# Patient Record
Sex: Female | Born: 1945 | Race: White | Hispanic: No | Marital: Married | Smoking: Former smoker
Health system: Southern US, Community
[De-identification: ages and names within clinical notes are randomized; demographics above are authoritative.]

## PROBLEM LIST (undated history)

## (undated) DIAGNOSIS — T753XXA Motion sickness, initial encounter: Secondary | ICD-10-CM

## (undated) DIAGNOSIS — N83209 Unspecified ovarian cyst, unspecified side: Secondary | ICD-10-CM

## (undated) DIAGNOSIS — T7840XA Allergy, unspecified, initial encounter: Secondary | ICD-10-CM

## (undated) DIAGNOSIS — E559 Vitamin D deficiency, unspecified: Secondary | ICD-10-CM

## (undated) DIAGNOSIS — M8580 Other specified disorders of bone density and structure, unspecified site: Secondary | ICD-10-CM

## (undated) DIAGNOSIS — M1990 Unspecified osteoarthritis, unspecified site: Secondary | ICD-10-CM

## (undated) DIAGNOSIS — H409 Unspecified glaucoma: Secondary | ICD-10-CM

## (undated) DIAGNOSIS — E7800 Pure hypercholesterolemia, unspecified: Secondary | ICD-10-CM

## (undated) DIAGNOSIS — E041 Nontoxic single thyroid nodule: Secondary | ICD-10-CM

## (undated) HISTORY — PX: OVARIAN CYST REMOVAL: SHX89

## (undated) HISTORY — DX: Pure hypercholesterolemia, unspecified: E78.00

## (undated) HISTORY — DX: Nontoxic single thyroid nodule: E04.1

## (undated) HISTORY — DX: Unspecified ovarian cyst, unspecified side: N83.209

## (undated) HISTORY — PX: COLONOSCOPY W/ POLYPECTOMY: SHX1380

## (undated) HISTORY — DX: Vitamin D deficiency, unspecified: E55.9

## (undated) HISTORY — DX: Allergy, unspecified, initial encounter: T78.40XA

---

## 2004-09-08 ENCOUNTER — Ambulatory Visit: Payer: Self-pay | Admitting: Otolaryngology

## 2004-10-18 ENCOUNTER — Ambulatory Visit: Payer: Self-pay | Admitting: Internal Medicine

## 2005-11-22 ENCOUNTER — Ambulatory Visit: Payer: Self-pay | Admitting: Internal Medicine

## 2006-10-24 ENCOUNTER — Ambulatory Visit: Payer: Self-pay | Admitting: Unknown Physician Specialty

## 2006-10-24 LAB — HM COLONOSCOPY

## 2007-03-19 ENCOUNTER — Ambulatory Visit: Payer: Self-pay | Admitting: Internal Medicine

## 2008-04-29 ENCOUNTER — Ambulatory Visit: Payer: Self-pay | Admitting: Internal Medicine

## 2009-04-29 ENCOUNTER — Ambulatory Visit: Payer: Self-pay | Admitting: Internal Medicine

## 2009-05-04 ENCOUNTER — Ambulatory Visit: Payer: Self-pay | Admitting: Internal Medicine

## 2010-05-17 ENCOUNTER — Ambulatory Visit: Payer: Self-pay | Admitting: Internal Medicine

## 2011-02-06 LAB — HM PAP SMEAR: HM Pap smear: NEGATIVE

## 2011-05-22 ENCOUNTER — Ambulatory Visit: Payer: Self-pay | Admitting: Internal Medicine

## 2012-09-19 ENCOUNTER — Ambulatory Visit: Payer: Self-pay | Admitting: Internal Medicine

## 2012-10-08 ENCOUNTER — Encounter: Payer: Self-pay | Admitting: Internal Medicine

## 2012-10-08 DIAGNOSIS — E041 Nontoxic single thyroid nodule: Secondary | ICD-10-CM | POA: Insufficient documentation

## 2012-10-08 DIAGNOSIS — E7800 Pure hypercholesterolemia, unspecified: Secondary | ICD-10-CM

## 2012-10-09 ENCOUNTER — Telehealth: Payer: Self-pay | Admitting: Emergency Medicine

## 2012-10-09 DIAGNOSIS — Z1239 Encounter for other screening for malignant neoplasm of breast: Secondary | ICD-10-CM

## 2012-10-09 NOTE — Telephone Encounter (Signed)
Can we please place an order for a mammo @ [facility].

## 2012-10-09 NOTE — Telephone Encounter (Signed)
Order placed for mammogram.  Vickie Crawford should be calling her with an appt.

## 2012-10-09 NOTE — Telephone Encounter (Signed)
Order placed for mammogram.

## 2012-10-10 ENCOUNTER — Ambulatory Visit: Payer: Self-pay | Admitting: Internal Medicine

## 2012-10-14 NOTE — Telephone Encounter (Signed)
Patient notified

## 2012-10-29 ENCOUNTER — Ambulatory Visit: Payer: Self-pay | Admitting: Internal Medicine

## 2012-10-29 ENCOUNTER — Encounter: Payer: Self-pay | Admitting: Internal Medicine

## 2012-11-01 ENCOUNTER — Ambulatory Visit: Payer: Self-pay | Admitting: Internal Medicine

## 2012-11-12 ENCOUNTER — Telehealth: Payer: Self-pay | Admitting: Internal Medicine

## 2012-11-12 NOTE — Telephone Encounter (Signed)
Left message for pt to call office   Dr Lorin Picket wanted to know if she could come in 3/19 @ 1

## 2012-11-12 NOTE — Telephone Encounter (Signed)
FYI

## 2012-11-12 NOTE — Telephone Encounter (Signed)
Put patient on cancellation list . She is unable to come to the 1:00 appointment on 3.19.14 . She is in [place] her son is in the hospital.

## 2012-11-15 ENCOUNTER — Encounter: Payer: Self-pay | Admitting: Internal Medicine

## 2012-11-21 ENCOUNTER — Telehealth: Payer: Self-pay | Admitting: Internal Medicine

## 2012-11-21 NOTE — Telephone Encounter (Signed)
Time already taken when pt called back

## 2012-11-21 NOTE — Telephone Encounter (Signed)
Left message for pt to call office  PLEASE loot at dr scott scheudle to see if there is an opening on 3/28  wanting to see if she could see dr Lorin Picket on 3/28 in the am.

## 2012-12-02 ENCOUNTER — Telehealth: Payer: Self-pay | Admitting: Internal Medicine

## 2012-12-02 MED ORDER — MELOXICAM 7.5 MG PO TABS: 7.5000 mg | ORAL_TABLET | Freq: Every day | ORAL | Status: DC

## 2012-12-02 MED ORDER — SIMVASTATIN 10 MG PO TABS: 10.0000 mg | ORAL_TABLET | Freq: Every day | ORAL | Status: DC

## 2012-12-02 NOTE — Telephone Encounter (Signed)
#  30 with no refills - mobic and simvastatin  Missed appt due to snow.

## 2012-12-02 NOTE — Telephone Encounter (Signed)
meloxicam (MOBIC) 7.5 MG tablet  #30  simvastatin (ZOCOR) 10 MG tablet  #30

## 2012-12-02 NOTE — Telephone Encounter (Signed)
This patient has had several appointments and either canceled or been a no-show. She has appointment 4/24, do you want to give her any more refills?

## 2012-12-02 NOTE — Telephone Encounter (Signed)
Rx sent in to pharmacy. 

## 2012-12-06 ENCOUNTER — Telehealth: Payer: Self-pay | Admitting: Internal Medicine

## 2012-12-06 ENCOUNTER — Ambulatory Visit (INDEPENDENT_AMBULATORY_CARE_PROVIDER_SITE_OTHER): Payer: BC Managed Care – PPO | Admitting: Adult Health

## 2012-12-06 ENCOUNTER — Encounter: Payer: Self-pay | Admitting: Adult Health

## 2012-12-06 VITALS — BP 124/84 | HR 77 | Temp 98.2°F | Resp 16 | Wt 144.5 lb

## 2012-12-06 DIAGNOSIS — J329 Chronic sinusitis, unspecified: Secondary | ICD-10-CM

## 2012-12-06 DIAGNOSIS — J069 Acute upper respiratory infection, unspecified: Secondary | ICD-10-CM | POA: Insufficient documentation

## 2012-12-06 MED ORDER — AMOXICILLIN-POT CLAVULANATE 875-125 MG PO TABS: 1.0000 | ORAL_TABLET | Freq: Two times a day (BID) | ORAL | Status: DC

## 2012-12-06 MED ORDER — FLUTICASONE PROPIONATE 50 MCG/ACT NA SUSP: 2.0000 | Freq: Every day | NASAL | Status: DC

## 2012-12-06 NOTE — Progress Notes (Signed)
  Subjective:    Patient ID: Vickie Crawford, female    DOB: 30-Jul-1946, 67 y.o.   MRN: [id number]  HPI  Patient is a pleasant 67 year old female who presents to clinic with drainage and cough x 10 days. She has tried cough medicine and NyQuil without much help. Cough worse with lying down. She is coughing up discolored phlegm that is yellowish. Denies any fever or chills but she has not taken her temperature. She denies shortness of breath, wheezing or chest pain.   Current Outpatient Prescriptions on File Prior to Visit  Medication Sig Dispense Refill  . Calcium Carbonate-Vitamin D (CALTRATE 600+D PO) Take 1 tablet by mouth 2 (two) times daily.      . cholecalciferol (VITAMIN D) 1000 UNITS tablet Take 1,000 Units by mouth daily.      . fexofenadine (ALLEGRA) 180 MG tablet Take 180 mg by mouth daily.      . meloxicam (MOBIC) 7.5 MG tablet Take 1 tablet (7.5 mg total) by mouth daily.  30 tablet  0  . pyridOXINE (VITAMIN B-6) 100 MG tablet Take 100 mg by mouth daily.      . simvastatin (ZOCOR) 10 MG tablet Take 1 tablet (10 mg total) by mouth daily.  30 tablet  0   No current facility-administered medications on file prior to visit.      Review of Systems  Constitutional: Negative for fever and chills.  HENT: Positive for sore throat, postnasal drip and sinus pressure.   Respiratory: Positive for cough. Negative for chest tightness, shortness of breath and wheezing.   Cardiovascular: Negative for chest pain.  Gastrointestinal: Negative for nausea and vomiting.   BP 124/84  Pulse 77  Temp(Src) 98.2 F (36.8 C) (Oral)  Resp 16  Wt 144 lb 8 oz (65.545 kg)  SpO2 92%     Objective:   Physical Exam  Constitutional: She is oriented to person, place, and time. She appears well-developed and well-nourished.  HENT:  Head: Normocephalic and atraumatic.  Right Ear: External ear normal.  Left Ear: External ear normal.  Pharyngeal erythema without exudate. Drainage noted posterior pharynx.   Cardiovascular: Normal rate, regular rhythm and normal heart sounds.   Pulmonary/Chest: Effort normal and breath sounds normal. She has no wheezes. She has no rales.  Lymphadenopathy:    She has no cervical adenopathy.  Neurological: She is alert and oriented to person, place, and time.  Skin: Skin is warm and dry.  Psychiatric: She has a normal mood and affect. Her behavior is normal. Judgment and thought content normal.          Assessment & Plan:

## 2012-12-06 NOTE — Assessment & Plan Note (Signed)
Symptoms have been ongoing for greater than 10 days. Start Augmentin twice a day x7 days. Also start Flonase nasal spray 2 sprays into each nostril at bedtime. May use simple saline to irrigate sinuses or a nettie pot. RTC if symptoms do not improve within 3-4 days.

## 2012-12-06 NOTE — Patient Instructions (Addendum)
Start your antibiotic today. You will take this twice a day for 7 days.  You can also use flonase nasal spray - 2 sprays into each nostril at bedtime.  Simple saline or a nettie pot to irrigate the sinuses.    What is sinusitis? - Sinusitis is a condition that can cause a stuffy nose, pain in the face, and yellow or green discharge (mucus) from the nose. The sinuses are hollow areas in the bones of the face. They have a thin lining that normally makes a small amount of mucus. When this lining gets infected, it swells and makes extra mucus. This causes symptoms.   Sinusitis can occur when a person gets sick with a cold. The germs causing the cold can also infect the sinuses. Many times, a person feels like his or her cold is getting better. But then he or she gets sinusitis and begins to feel sick again.  What are the symptoms of sinusitis? - Common symptoms of sinusitis include: Stuffy or blocked nose  Thick yellow or green discharge from the nose  Pain in the teeth  Pain or pressure in the face - This often feels worse when a person bends forward.   People with sinusitis can also have other symptoms that include: Fever  Cough  Trouble smelling  Ear pressure or fullness  Headache  Bad breath  Feeling tired   Most of the time, symptoms start to improve in 7 to 10 days.  Should I see a doctor or nurse? - See your doctor or nurse if your symptoms last more than 7 days, or if your symptoms get better at first but then get worse. Sometimes, sinusitis can lead to serious problems. See your doctor or nurse right away (do not wait 7 days) if you have: Fever higher than 102.41F (39.2C)  Sudden and severe pain in the face and head  Trouble seeing or seeing double  Trouble thinking clearly  Swelling or redness around 1 or both eyes  Trouble breathing or a stiff neck   Is there anything I can do on my own to feel better? - Yes. To reduce your symptoms, you can: Take an  over-the-counter pain reliever to reduce the pain  Rinse your nose and sinuses with salt water a few times a day - Ask your doctor or nurse about the best way to do this.  Use a decongestant nose spray - These sprays are sold in a pharmacy. But do not use decongestant nose sprays for more than 2 to 3 days in a row. Using them more than 3 days in a row can make symptoms worse.   You should NOT take an antihistamine for sinusitis. Common antihistamines include diphenhydramine (sample brand name: Benadryl), chlorpheniramine (sample brand name: Chlor-Trimeton), loratadine (sample brand name: Claritin), and cetirizine (sample brand name: Zyrtec). They can treat allergies, but not sinus infections, and could increase your discomfort by drying the lining of your nose and sinuses, or making you tired.   Your doctor might also prescribe a steroid nose spray to reduce the swelling in your nose. (Steroid nose sprays do not contain the same steroids that athletes take to build muscle.)  How is sinusitis treated? - Most of the time, sinusitis does not need to be treated with antibiotic medicines. This is because most sinusitis is caused by viruses - not bacteria - and antibiotics do not kill viruses. Many people get over sinus infections without antibiotics.  Some people with sinusitis do need treatment  with antibiotics. If your symptoms have not improved after 7 to 10 days, ask your doctor if you should take antibiotics. Your doctor might recommend that you wait 1 more week to see if your symptoms improve. But if you have symptoms such as a fever or a lot of pain, he or she might prescribe antibiotics. It is important to follow your doctor's instructions about taking your antibiotics.

## 2012-12-06 NOTE — Telephone Encounter (Signed)
error 

## 2012-12-19 ENCOUNTER — Ambulatory Visit (INDEPENDENT_AMBULATORY_CARE_PROVIDER_SITE_OTHER): Payer: BC Managed Care – PPO | Admitting: Internal Medicine

## 2012-12-19 ENCOUNTER — Encounter: Payer: Self-pay | Admitting: Internal Medicine

## 2012-12-19 ENCOUNTER — Other Ambulatory Visit (HOSPITAL_COMMUNITY)
Admission: RE | Admit: 2012-12-19 | Discharge: 2012-12-19 | Disposition: A | Discharge status: Home / Self Care | Payer: BC Managed Care – PPO | Source: Ambulatory Visit | Attending: Internal Medicine | Admitting: Internal Medicine

## 2012-12-19 VITALS — BP 130/80 | HR 112 | Temp 98.8°F | Ht 60.25 in | Wt 141.5 lb

## 2012-12-19 DIAGNOSIS — Z01419 Encounter for gynecological examination (general) (routine) without abnormal findings: Secondary | ICD-10-CM | POA: Insufficient documentation

## 2012-12-19 DIAGNOSIS — E7800 Pure hypercholesterolemia, unspecified: Secondary | ICD-10-CM

## 2012-12-19 DIAGNOSIS — E559 Vitamin D deficiency, unspecified: Secondary | ICD-10-CM

## 2012-12-19 DIAGNOSIS — Z23 Encounter for immunization: Secondary | ICD-10-CM

## 2012-12-19 DIAGNOSIS — E041 Nontoxic single thyroid nodule: Secondary | ICD-10-CM

## 2012-12-19 DIAGNOSIS — Z124 Encounter for screening for malignant neoplasm of cervix: Secondary | ICD-10-CM

## 2012-12-19 DIAGNOSIS — Z1151 Encounter for screening for human papillomavirus (HPV): Secondary | ICD-10-CM | POA: Insufficient documentation

## 2012-12-19 DIAGNOSIS — M949 Disorder of cartilage, unspecified: Secondary | ICD-10-CM

## 2012-12-19 DIAGNOSIS — J329 Chronic sinusitis, unspecified: Secondary | ICD-10-CM

## 2012-12-19 DIAGNOSIS — M8580 Other specified disorders of bone density and structure, unspecified site: Secondary | ICD-10-CM

## 2012-12-22 ENCOUNTER — Encounter: Payer: Self-pay | Admitting: Internal Medicine

## 2012-12-22 DIAGNOSIS — E559 Vitamin D deficiency, unspecified: Secondary | ICD-10-CM | POA: Insufficient documentation

## 2012-12-22 DIAGNOSIS — M8580 Other specified disorders of bone density and structure, unspecified site: Secondary | ICD-10-CM | POA: Insufficient documentation

## 2012-12-22 NOTE — Assessment & Plan Note (Signed)
Resolved

## 2012-12-22 NOTE — Progress Notes (Signed)
Subjective:    Patient ID: Vickie Crawford, female    DOB: 1946/07/25, 67 y.o.   MRN: [id number]  HPI 67 year old female with past history of allergy problems and hypercholesterolemia who comes in today to follow up on these issues as well as for a complete physical exam.  She states she has been doing relatively well.  Stays active.  Played golf today.  Since Christmas, she has noticed some problems with swallowing.  Will notice (at times) when she is eating - that her food will stop.  Can't get it up or down.  After a short time, food will go down.  No significant acid reflux otherwise.  Bowels stable. No abdominal pain or cramping.  Had her eyes checked - Dr Oren Bracket.  Glaucoma.  Using eye drops now.     Past Medical History  Diagnosis Date  . Ovarian cyst     Hospitalized 1969 and 1974 for removal  . Allergy   . Hypercholesterolemia   . Thyroid nodule   . Vitamin D deficiency     Current Outpatient Prescriptions on File Prior to Visit  Medication Sig Dispense Refill  . Calcium Carbonate-Vitamin D (CALTRATE 600+D PO) Take 1 tablet by mouth 2 (two) times daily.      . cholecalciferol (VITAMIN D) 1000 UNITS tablet Take 1,000 Units by mouth daily.      . fexofenadine (ALLEGRA) 180 MG tablet Take 180 mg by mouth daily.      . fluticasone (FLONASE) 50 MCG/ACT nasal spray Place 2 sprays into the nose daily.  16 g  6  . latanoprost (XALATAN) 0.005 % ophthalmic solution Place 1 drop into the left eye Nightly.      . meloxicam (MOBIC) 7.5 MG tablet Take 1 tablet (7.5 mg total) by mouth daily.  30 tablet  0  . pyridOXINE (VITAMIN B-6) 100 MG tablet Take 100 mg by mouth daily.      . simvastatin (ZOCOR) 10 MG tablet Take 1 tablet (10 mg total) by mouth daily.  30 tablet  0   No current facility-administered medications on file prior to visit.    Review of Systems Patient denies any headache, lightheadedness or dizziness.  No significant sinus or allergy symptoms currently.  No chest pain, tightness  or palpitations.  No increased shortness of breath, cough or congestion.  No significant increased acid reflux.  Does report the swallowing change as outlined.  Just occurs intermittently.  No nausea or vomiting.  No abdominal pain or cramping.  No bowel change, such as diarrhea, constipation, BRBPR or melana.  No urine change.  No vaginal problems.      Objective:   Physical Exam Filed Vitals:   12/19/12 1404  BP: 130/80  Pulse: 112  Temp: 98.8 F (70.33 C)   67 year old female in no acute distress.   HEENT:  Nares- clear.  Oropharynx - without lesions. NECK:  Supple.  Nontender.  No audible bruit.  HEART:  Appears to be regular. LUNGS:  No crackles or wheezing audible.  Respirations even and unlabored.  RADIAL PULSE:  Equal bilaterally.    BREASTS:  No nipple discharge or nipple retraction present.  Could not appreciate any distinct nodules or axillary adenopathy.  ABDOMEN:  Soft, nontender.  Bowel sounds present and normal.  No audible abdominal bruit.  GU:  Normal external genitalia.  Vaginal vault without lesions.  Cervix identified.  Pap performed. Could not appreciate any adnexal masses or tenderness.   RECTAL:  Heme negative.   EXTREMITIES:  No increased edema present.  DP pulses palpable and equal bilaterally.          Assessment & Plan:  SWALLOWING DIFFICULTY.  Symptoms as outlined.  Just occurs intermittently.  No significant acid reflux.  Will start her on Zantac 150mg  q day - as directed.   Follow.  Get her back in soon to reassess.  Follow.    CARDIOVASCULAR.  Very active.  No cardiac symptoms with increased activity or exertion.   MSK.  Doing well with Mobic.  Follow.  Monitor renal function and for GI side effects.   HEALTH MAINTENANCE.  Physical today.  Pap today.  Colonoscopy 10/24/06 - two 4mm polyps in the sigmoid - hyperplastic.  Recommended follow up colonoscopy 2018.  Mammogram 10/28/12 - Birads II.

## 2012-12-22 NOTE — Assessment & Plan Note (Signed)
Continue replacement.  Recheck vitamin d level.

## 2012-12-22 NOTE — Assessment & Plan Note (Signed)
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  Tiny hypoechoic nodules in each lobe with no associated microcalcifications.  Check thyroid function.  May need follow up thyroid ultrasound.  She has declined previously.  Follow.

## 2012-12-22 NOTE — Assessment & Plan Note (Signed)
Low cholesterol diet and exercise.  Check lipid panel.   

## 2012-12-22 NOTE — Assessment & Plan Note (Signed)
Bone density 2007 - improved.  Continue calcium and vitamin D. Have discussed f/u bone density.    

## 2012-12-25 ENCOUNTER — Other Ambulatory Visit (INDEPENDENT_AMBULATORY_CARE_PROVIDER_SITE_OTHER): Payer: BC Managed Care – PPO

## 2012-12-25 DIAGNOSIS — E559 Vitamin D deficiency, unspecified: Secondary | ICD-10-CM

## 2012-12-25 DIAGNOSIS — E7800 Pure hypercholesterolemia, unspecified: Secondary | ICD-10-CM

## 2012-12-25 DIAGNOSIS — E041 Nontoxic single thyroid nodule: Secondary | ICD-10-CM

## 2012-12-25 LAB — CBC WITH DIFFERENTIAL/PLATELET
Basophils Absolute: 0.1 10*3/uL (ref 0.0–0.1)
Eosinophils Absolute: 0.1 10*3/uL (ref 0.0–0.7)
Hemoglobin: 15.1 g/dL — ABNORMAL HIGH (ref 12.0–15.0)
Lymphocytes Relative: 27.9 % (ref 12.0–46.0)
MCHC: 34.1 g/dL (ref 30.0–36.0)
Monocytes Relative: 10.2 % (ref 3.0–12.0)
Neutro Abs: 2.4 10*3/uL (ref 1.4–7.7)
Neutrophils Relative %: 56.8 % (ref 43.0–77.0)
Platelets: 267 10*3/uL (ref 150.0–400.0)
RDW: 12.5 % (ref 11.5–14.6)

## 2012-12-25 LAB — TSH: TSH: 1.37 u[IU]/mL (ref 0.35–5.50)

## 2012-12-25 LAB — LIPID PANEL
HDL: 87.7 mg/dL (ref >39.00)
Total CHOL/HDL Ratio: 2
VLDL: 10.8 mg/dL (ref 0.0–40.0)

## 2012-12-25 LAB — COMPREHENSIVE METABOLIC PANEL
ALT: 37 U/L — ABNORMAL HIGH (ref 0–35)
AST: 30 U/L (ref 0–37)
Calcium: 9.3 mg/dL (ref 8.4–10.5)
Chloride: 106 mEq/L (ref 96–112)
Creatinine, Ser: 0.6 mg/dL (ref 0.4–1.2)
Sodium: 141 mEq/L (ref 135–145)
Total Protein: 6.3 g/dL (ref 6.0–8.3)

## 2012-12-25 LAB — T4, FREE: Free T4: 0.73 ng/dL (ref 0.60–1.60)

## 2012-12-26 LAB — VITAMIN D 25 HYDROXY (VIT D DEFICIENCY, FRACTURES): Vit D, 25-Hydroxy: 59 ng/mL (ref 30–89)

## 2012-12-28 ENCOUNTER — Other Ambulatory Visit: Payer: Self-pay | Admitting: Internal Medicine

## 2012-12-28 DIAGNOSIS — R945 Abnormal results of liver function studies: Secondary | ICD-10-CM

## 2012-12-28 DIAGNOSIS — D72819 Decreased white blood cell count, unspecified: Secondary | ICD-10-CM

## 2012-12-28 NOTE — Progress Notes (Signed)
Orders placed for follow up labs 

## 2012-12-30 ENCOUNTER — Encounter: Payer: Self-pay | Admitting: *Deleted

## 2013-01-01 ENCOUNTER — Other Ambulatory Visit: Payer: Self-pay | Admitting: Internal Medicine

## 2013-01-01 ENCOUNTER — Telehealth: Payer: Self-pay | Admitting: Internal Medicine

## 2013-01-01 NOTE — Telephone Encounter (Signed)
Rx sent to pharmacy by [facility] for Simvastatin. Ok to refill Meloxicam?

## 2013-01-01 NOTE — Telephone Encounter (Signed)
Ordered refill meloxicam #30 with one refill

## 2013-01-01 NOTE — Telephone Encounter (Signed)
Zocor needed.

## 2013-01-15 ENCOUNTER — Other Ambulatory Visit (INDEPENDENT_AMBULATORY_CARE_PROVIDER_SITE_OTHER): Payer: BC Managed Care – PPO

## 2013-01-15 DIAGNOSIS — D72819 Decreased white blood cell count, unspecified: Secondary | ICD-10-CM

## 2013-01-15 DIAGNOSIS — R945 Abnormal results of liver function studies: Secondary | ICD-10-CM

## 2013-01-15 DIAGNOSIS — R7989 Other specified abnormal findings of blood chemistry: Secondary | ICD-10-CM

## 2013-01-15 LAB — HEPATIC FUNCTION PANEL
ALT: 32 U/L (ref 0–35)
AST: 29 U/L (ref 0–37)
Albumin: 3.8 g/dL (ref 3.5–5.2)
Alkaline Phosphatase: 61 U/L (ref 39–117)

## 2013-01-15 LAB — CBC WITH DIFFERENTIAL/PLATELET
Basophils Absolute: 0.1 10*3/uL (ref 0.0–0.1)
Eosinophils Relative: 3.2 % (ref 0.0–5.0)
HCT: 44.5 % (ref 36.0–46.0)
Hemoglobin: 15.1 g/dL — ABNORMAL HIGH (ref 12.0–15.0)
Lymphocytes Relative: 31.1 % (ref 12.0–46.0)
Lymphs Abs: 1.5 10*3/uL (ref 0.7–4.0)
Monocytes Relative: 10.8 % (ref 3.0–12.0)
Neutro Abs: 2.6 10*3/uL (ref 1.4–7.7)
RBC: 4.98 Mil/uL (ref 3.87–5.11)
RDW: 12.7 % (ref 11.5–14.6)
WBC: 4.9 10*3/uL (ref 4.5–10.5)

## 2013-01-16 ENCOUNTER — Encounter: Payer: Self-pay | Admitting: *Deleted

## 2013-01-29 ENCOUNTER — Ambulatory Visit (INDEPENDENT_AMBULATORY_CARE_PROVIDER_SITE_OTHER): Payer: BC Managed Care – PPO | Admitting: Internal Medicine

## 2013-01-29 ENCOUNTER — Encounter: Payer: Self-pay | Admitting: Internal Medicine

## 2013-01-29 VITALS — BP 130/80 | HR 78 | Temp 98.5°F | Ht 60.25 in | Wt 143.0 lb

## 2013-01-29 DIAGNOSIS — E7800 Pure hypercholesterolemia, unspecified: Secondary | ICD-10-CM

## 2013-01-29 MED ORDER — PANTOPRAZOLE SODIUM 40 MG PO TBEC: 40.0000 mg | DELAYED_RELEASE_TABLET | Freq: Every day | ORAL | Status: DC

## 2013-02-02 ENCOUNTER — Encounter: Payer: Self-pay | Admitting: Internal Medicine

## 2013-02-02 NOTE — Assessment & Plan Note (Signed)
Low cholesterol diet and exercise.  Follow lipid panel.   

## 2013-02-02 NOTE — Progress Notes (Signed)
Subjective:    Patient ID: Vickie Crawford, female    DOB: 1945-12-26, 67 y.o.   MRN: [id number]  HPI 67 year old female with past history of allergy problems and hypercholesterolemia who comes in today for a scheduled follow up.  She states she has been doing relatively well.  Stays active.  Since Christmas, she had noticed some problems with swallowing.  Noticed (at times) when she was eating - that her food would stop.  No significant acid reflux otherwise.  Was started on zantac last visit. Symptoms have improved.  She has had a couple of episodes since her last visit here.  occures mostly - late in the day.  She did notice some burning on one occasion.  Bowels stable. No abdominal pain or cramping.  Had her eyes checked - Dr Oren Bracket.  Glaucoma.  Using eye drops now.     Past Medical History  Diagnosis Date  . Ovarian cyst     Hospitalized 1969 and 1974 for removal  . Allergy   . Hypercholesterolemia   . Thyroid nodule   . Vitamin D deficiency     Current Outpatient Prescriptions on File Prior to Visit  Medication Sig Dispense Refill  . Calcium Carbonate-Vitamin D (CALTRATE 600+D PO) Take 1 tablet by mouth 2 (two) times daily.      . cholecalciferol (VITAMIN D) 1000 UNITS tablet Take 1,000 Units by mouth daily.      . fexofenadine (ALLEGRA) 180 MG tablet Take 180 mg by mouth daily.      . fluticasone (FLONASE) 50 MCG/ACT nasal spray Place 2 sprays into the nose daily.  16 g  6  . latanoprost (XALATAN) 0.005 % ophthalmic solution Place 1 drop into the left eye Nightly.      . meloxicam (MOBIC) 7.5 MG tablet TAKE 1 TABLET (7.5 MG TOTAL) BY MOUTH DAILY.  30 tablet  1  . pyridOXINE (VITAMIN B-6) 100 MG tablet Take 100 mg by mouth daily.      . simvastatin (ZOCOR) 10 MG tablet TAKE 1 TABLET (10 MG TOTAL) BY MOUTH DAILY.  30 tablet  5   No current facility-administered medications on file prior to visit.    Review of Systems Patient denies any headache, lightheadedness or dizziness.  No  significant sinus or allergy symptoms currently.  No chest pain, tightness or palpitations.  No increased shortness of breath, cough or congestion.  No significant increased acid reflux.  Does report the swallowing change as outlined.  Just occurs intermittently.  Has improved on zantac.  No nausea or vomiting.  No abdominal pain or cramping.  No bowel change.      Objective:   Physical Exam  Filed Vitals:   01/29/13 1143  BP: 130/80  Pulse: 78  Temp: 98.5 F (79.75 C)   67 year old female in no acute distress.   HEENT:  Nares- clear.  Oropharynx - without lesions. NECK:  Supple.  Nontender.  No audible bruit.  HEART:  Appears to be regular. LUNGS:  No crackles or wheezing audible.  Respirations even and unlabored.  RADIAL PULSE:  Equal bilaterally.  ABDOMEN:  Soft, nontender.  Bowel sounds present and normal.  No audible abdominal bruit.   EXTREMITIES:  No increased edema present.  DP pulses palpable and equal bilaterally.          Assessment & Plan:  SWALLOWING DIFFICULTY.  Symptoms as outlined.  Started on zantac last visit.  Improved.  Will change to protonix 40mg  q day.  Avoid foods that aggravate.  Avoid eating late.  Follow.  Get her back in soon to reassess.     CARDIOVASCULAR.  Very active.  No cardiac symptoms with increased activity or exertion.   MSK.  Doing well with Mobic.  Follow.  Monitor renal function and for GI side effects.   HEALTH MAINTENANCE.  Physical last visit.  Colonoscopy 10/24/06 - two 4mm polyps in the sigmoid - hyperplastic.  Recommended follow up colonoscopy 2018.  Mammogram 10/28/12 - Birads II.

## 2013-02-04 ENCOUNTER — Encounter: Payer: Self-pay | Admitting: Internal Medicine

## 2013-04-19 ENCOUNTER — Other Ambulatory Visit: Payer: Self-pay | Admitting: Internal Medicine

## 2013-05-08 ENCOUNTER — Encounter: Payer: Self-pay | Admitting: Internal Medicine

## 2013-05-08 ENCOUNTER — Ambulatory Visit (INDEPENDENT_AMBULATORY_CARE_PROVIDER_SITE_OTHER): Payer: BC Managed Care – PPO | Admitting: Internal Medicine

## 2013-05-08 VITALS — BP 120/90 | HR 96 | Temp 98.5°F | Ht 60.25 in | Wt 142.2 lb

## 2013-05-08 DIAGNOSIS — E041 Nontoxic single thyroid nodule: Secondary | ICD-10-CM

## 2013-05-08 DIAGNOSIS — E559 Vitamin D deficiency, unspecified: Secondary | ICD-10-CM

## 2013-05-08 DIAGNOSIS — M899 Disorder of bone, unspecified: Secondary | ICD-10-CM

## 2013-05-08 DIAGNOSIS — E7800 Pure hypercholesterolemia, unspecified: Secondary | ICD-10-CM

## 2013-05-08 DIAGNOSIS — M8580 Other specified disorders of bone density and structure, unspecified site: Secondary | ICD-10-CM

## 2013-05-08 MED ORDER — PANTOPRAZOLE SODIUM 40 MG PO TBEC: 40.0000 mg | DELAYED_RELEASE_TABLET | Freq: Every day | ORAL | Status: DC

## 2013-05-09 MED ORDER — MELOXICAM 7.5 MG PO TABS: 7.5000 mg | ORAL_TABLET | Freq: Every day | ORAL | Status: DC

## 2013-05-11 ENCOUNTER — Encounter: Payer: Self-pay | Admitting: Internal Medicine

## 2013-05-11 NOTE — Assessment & Plan Note (Addendum)
Continue replacement.  Follow vitamin d level.   

## 2013-05-11 NOTE — Progress Notes (Signed)
Subjective:    Patient ID: Vickie Crawford, female    DOB: 22-Feb-1946, 67 y.o.   MRN: [id number]  HPI 67 year old female with past history of allergy problems and hypercholesterolemia who comes in today for a scheduled follow up.  She states she has been doing relatively well.  Stays active.   Was having problems with swallowing.  Some acid.  See the previous notes for details.  Was started on protonix.  State symptoms have resolved.  Doing well.  No problems.  Bowels stable. No abdominal pain or cramping.  Had her eyes checked - Dr Oren Bracket following.  Has glaucoma.  States her blood pressure has been averaging 120s/70s.  Overall she feels she is doing well.     Past Medical History  Diagnosis Date  . Ovarian cyst     Hospitalized 1969 and 1974 for removal  . Allergy   . Hypercholesterolemia   . Thyroid nodule   . Vitamin D deficiency     Current Outpatient Prescriptions on File Prior to Visit  Medication Sig Dispense Refill  . Calcium Carbonate-Vitamin D (CALTRATE 600+D PO) Take 1 tablet by mouth 2 (two) times daily.      . cholecalciferol (VITAMIN D) 1000 UNITS tablet Take 1,000 Units by mouth daily.      . fexofenadine (ALLEGRA) 180 MG tablet Take 180 mg by mouth daily.      . fluticasone (FLONASE) 50 MCG/ACT nasal spray Place 2 sprays into the nose daily.  16 g  6  . pyridOXINE (VITAMIN B-6) 100 MG tablet Take 100 mg by mouth daily.      . simvastatin (ZOCOR) 10 MG tablet TAKE 1 TABLET (10 MG TOTAL) BY MOUTH DAILY.  30 tablet  5   No current facility-administered medications on file prior to visit.    Review of Systems Patient denies any headache, lightheadedness or dizziness.  No significant sinus or allergy symptoms currently.  No chest pain, tightness or palpitations.  No increased shortness of breath, cough or congestion.  No acid reflux.  No swallowing issues.  No nausea or vomiting.  No abdominal pain or cramping.  No bowel change.      Objective:   Physical Exam  Filed  Vitals:   05/08/13 1507  BP: 120/90  Pulse: 96  Temp: 98.5 F (36.9 C)   Blood pressure recheck:  66/71  67 year old female in no acute distress.   HEENT:  Nares- clear.  Oropharynx - without lesions. NECK:  Supple.  Nontender.  No audible bruit.  HEART:  Appears to be regular. LUNGS:  No crackles or wheezing audible.  Respirations even and unlabored.  RADIAL PULSE:  Equal bilaterally.  ABDOMEN:  Soft, nontender.  Bowel sounds present and normal.  No audible abdominal bruit.   EXTREMITIES:  No increased edema present.  DP pulses palpable and equal bilaterally.          Assessment & Plan:  SWALLOWING DIFFICULTY.  On protonix and doing well.  Follow.  No acid reflux.     CARDIOVASCULAR.  Very active.  No cardiac symptoms with increased activity or exertion.   MSK.  Doing well with Mobic.  Follow.  Monitor renal function and for GI side effects.  States she needs the mobic.  Discussed possible side effects and risk of medication.   ELEVATED BLOOD PRESSURE.  Her outside checks under good control.  Recheck here improved.  Follow.   HEALTH MAINTENANCE.  Physical 12/19/12.   Colonoscopy 10/24/06 -  two 4mm polyps in the sigmoid - hyperplastic.  Recommended follow up colonoscopy 2018.  Mammogram 10/28/12 - Birads II.

## 2013-05-11 NOTE — Assessment & Plan Note (Signed)
Bone density 2007 - improved.  Continue calcium and vitamin D. Have discussed f/u bone density.    

## 2013-05-11 NOTE — Assessment & Plan Note (Signed)
Low cholesterol diet and exercise.  Follow lipid panel.   

## 2013-05-11 NOTE — Assessment & Plan Note (Signed)
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  Tiny hypoechoic nodules in each lobe with no associated microcalcifications.  Follow thyroid function.  May need follow up thyroid ultrasound.  She has declined previously. Follow.   

## 2013-06-02 ENCOUNTER — Telehealth: Payer: Self-pay

## 2013-06-02 ENCOUNTER — Other Ambulatory Visit: Payer: Self-pay | Admitting: *Deleted

## 2013-06-02 MED ORDER — PANTOPRAZOLE SODIUM 40 MG PO TBEC: 40.0000 mg | DELAYED_RELEASE_TABLET | Freq: Every day | ORAL | Status: DC

## 2013-06-02 NOTE — Telephone Encounter (Signed)
Pt left v/m requesting status of Pantoprazole refill to [facility]. [facility] said [facility] not [facility]. Pt request cb. Notified pt sent request to [facility] Dr Roby Lofts assistant.

## 2013-06-02 NOTE — Telephone Encounter (Signed)
Rx sent to [facility] & left pt a v/m to let her know

## 2013-06-06 ENCOUNTER — Telehealth: Payer: Self-pay | Admitting: *Deleted

## 2013-06-06 NOTE — Telephone Encounter (Signed)
The patient is needing prior authorization for her pantoprazole (PROTONIX) 40 MG tablet.  Please call the patient when this has been completed.

## 2013-06-06 NOTE — Telephone Encounter (Signed)
Called [phone] for prior authorization on Pantoprazole 40 mg, form is being faxed over

## 2013-06-06 NOTE — Telephone Encounter (Signed)
Pt was notified of prior authorization being stated, also let her know if can take up to a week for the process

## 2013-06-09 NOTE — Telephone Encounter (Signed)
PA request form has been faxed over

## 2013-09-04 ENCOUNTER — Other Ambulatory Visit: Payer: Self-pay | Admitting: Internal Medicine

## 2013-09-06 ENCOUNTER — Other Ambulatory Visit: Payer: Self-pay | Admitting: Internal Medicine

## 2013-09-08 ENCOUNTER — Other Ambulatory Visit: Payer: Self-pay | Admitting: Internal Medicine

## 2013-09-15 ENCOUNTER — Ambulatory Visit (INDEPENDENT_AMBULATORY_CARE_PROVIDER_SITE_OTHER): Payer: BC Managed Care – PPO | Admitting: Internal Medicine

## 2013-09-15 ENCOUNTER — Encounter: Payer: Self-pay | Admitting: Internal Medicine

## 2013-09-15 VITALS — BP 122/70 | HR 87 | Temp 98.1°F | Resp 12 | Wt 142.0 lb

## 2013-09-15 DIAGNOSIS — J069 Acute upper respiratory infection, unspecified: Secondary | ICD-10-CM

## 2013-09-15 MED ORDER — AZITHROMYCIN 250 MG PO TABS
ORAL_TABLET | ORAL | Status: DC
Start: 2013-09-15 — End: 2013-12-09

## 2013-09-15 NOTE — Patient Instructions (Signed)
Saline nasal spray - flush nose at least 2-3x/day.  Flonase nasal spray - 2 sprays each nostril one time per day (do this in the evening).   mucinex DM in the am and Robitussin DM in the evening.     If needed - afrin nasal spray - 2 sprays each nostril 2x/day  For three days only.

## 2013-09-15 NOTE — Progress Notes (Signed)
Pre visit review using our clinic review tool, if applicable. No additional management support is needed unless otherwise documented below in the visit note. 

## 2013-09-17 ENCOUNTER — Encounter: Payer: Self-pay | Admitting: Internal Medicine

## 2013-09-17 NOTE — Progress Notes (Signed)
Subjective:    Patient ID: Vickie Crawford, female    DOB: [birth year], 68 y.o.   MRN: [id number]  Cough  68 year old female with past history of allergy problems and hypercholesterolemia who comes in today as a work in with concerns regarding increased and persistent cough.  She states she has been doing relatively well.  Stays active.   Was having problems with swallowing.  Some acid.  See the previous notes for details.  Was started on protonix.  State symptoms have resolved and does well on protonix.  States that starting approximately 2.5 weeks ago, she noticed some ringing in her ears.  Throat fullness.  Deep cough has developed and persisted.  Some increased congestion, but cough mostly non productive.  Some hoarseness.  Increased drainage.   Bowels stable.  Has been taking Nyquil.    Past Medical History  Diagnosis Date  . Ovarian cyst     Hospitalized 1969 and 1974 for removal  . Allergy   . Hypercholesterolemia   . Thyroid nodule   . Vitamin D deficiency     Current Outpatient Prescriptions on File Prior to Visit  Medication Sig Dispense Refill  . Calcium Carbonate-Vitamin D (CALTRATE 600+D PO) Take 1 tablet by mouth 2 (two) times daily.      . cholecalciferol (VITAMIN D) 1000 UNITS tablet Take 1,000 Units by mouth daily.      . fexofenadine (ALLEGRA) 180 MG tablet Take 180 mg by mouth daily.      . fluticasone (FLONASE) 50 MCG/ACT nasal spray Place 2 sprays into the nose daily.  16 g  6  . meloxicam (MOBIC) 7.5 MG tablet TAKE 1 TABLET (7.5 MG TOTAL) BY MOUTH DAILY.  30 tablet  2  . pantoprazole (PROTONIX) 40 MG tablet Take 1 tablet (40 mg total) by mouth daily.  30 tablet  5  . pantoprazole (PROTONIX) 40 MG tablet TAKE 1 TABLET (40 MG TOTAL) BY MOUTH DAILY. (PA REQUIRED)  30 tablet  5  . pyridOXINE (VITAMIN B-6) 100 MG tablet Take 100 mg by mouth daily.      . simvastatin (ZOCOR) 10 MG tablet TAKE 1 TABLET (10 MG TOTAL) BY MOUTH DAILY.  30 tablet  5   No current  facility-administered medications on file prior to visit.    Review of Systems  Respiratory: Positive for cough.   Patient denies any headache, lightheadedness or dizziness.  No sinus pressure.  Some ear fullness.  Increased drainage and hoarseness.   No chest pain, tightness or palpitations.  No increased shortness of breath.  Some cough and congestion.  No wheezing.   No acid reflux.  No swallowing issues.  No nausea or vomiting.  No bowel change.      Objective:   Physical Exam  Filed Vitals:   09/15/13 1642  BP: 122/70  Pulse: 87  Temp: 98.1 F (36.7 C)  Resp: 62   68 year old female in no acute distress.   HEENT:  Nares- clear.  Oropharynx - without lesions. NECK:  Supple.  Nontender.  No audible bruit.  HEART:  Appears to be regular. LUNGS:  No crackles or wheezing audible.  Respirations even and unlabored.          Assessment & Plan:  SWALLOWING DIFFICULTY.  On protonix and doing well.  Follow.  No acid reflux.     CARDIOVASCULAR.  Very active.  No cardiac symptoms with increased activity or exertion.   HEALTH MAINTENANCE.  Physical 12/19/12.  Colonoscopy 10/24/06 - two 4mm polyps in the sigmoid - hyperplastic.  Recommended follow up colonoscopy 2018.  Mammogram 10/28/12 - Birads II.

## 2013-09-17 NOTE — Assessment & Plan Note (Signed)
Persistent cough and congestion as outlined.  zpak as directed.  Saline nasal spray and flonase as directed.  [place]  Mucinex DM in the morning and Robitussin in the evening. Follow.  Hold on cxr.  Will need to be reevaluated if persistent.

## 2013-12-08 ENCOUNTER — Other Ambulatory Visit: Payer: Self-pay | Admitting: Internal Medicine

## 2013-12-08 NOTE — Telephone Encounter (Signed)
Refilled mobic #30 with one refill 

## 2013-12-09 ENCOUNTER — Ambulatory Visit (INDEPENDENT_AMBULATORY_CARE_PROVIDER_SITE_OTHER): Payer: Federal, State, Local not specified - PPO | Admitting: Internal Medicine

## 2013-12-09 ENCOUNTER — Encounter (INDEPENDENT_AMBULATORY_CARE_PROVIDER_SITE_OTHER): Payer: Self-pay

## 2013-12-09 ENCOUNTER — Encounter: Payer: Self-pay | Admitting: Internal Medicine

## 2013-12-09 VITALS — BP 130/80 | HR 85 | Temp 98.1°F | Ht 60.25 in | Wt 143.8 lb

## 2013-12-09 DIAGNOSIS — E041 Nontoxic single thyroid nodule: Secondary | ICD-10-CM

## 2013-12-09 DIAGNOSIS — L989 Disorder of the skin and subcutaneous tissue, unspecified: Secondary | ICD-10-CM

## 2013-12-09 DIAGNOSIS — E559 Vitamin D deficiency, unspecified: Secondary | ICD-10-CM

## 2013-12-09 DIAGNOSIS — M949 Disorder of cartilage, unspecified: Secondary | ICD-10-CM

## 2013-12-09 DIAGNOSIS — J069 Acute upper respiratory infection, unspecified: Secondary | ICD-10-CM

## 2013-12-09 DIAGNOSIS — M8580 Other specified disorders of bone density and structure, unspecified site: Secondary | ICD-10-CM

## 2013-12-09 DIAGNOSIS — M899 Disorder of bone, unspecified: Secondary | ICD-10-CM

## 2013-12-09 DIAGNOSIS — E7800 Pure hypercholesterolemia, unspecified: Secondary | ICD-10-CM

## 2013-12-09 NOTE — Progress Notes (Signed)
Pre visit review using our clinic review tool, if applicable. No additional management support is needed unless otherwise documented below in the visit note. 

## 2013-12-12 ENCOUNTER — Telehealth: Payer: Self-pay | Admitting: *Deleted

## 2013-12-12 ENCOUNTER — Telehealth: Payer: Self-pay | Admitting: Internal Medicine

## 2013-12-12 ENCOUNTER — Encounter: Payer: Self-pay | Admitting: Internal Medicine

## 2013-12-12 ENCOUNTER — Other Ambulatory Visit (INDEPENDENT_AMBULATORY_CARE_PROVIDER_SITE_OTHER): Payer: Federal, State, Local not specified - PPO

## 2013-12-12 DIAGNOSIS — E041 Nontoxic single thyroid nodule: Secondary | ICD-10-CM

## 2013-12-12 DIAGNOSIS — Z1239 Encounter for other screening for malignant neoplasm of breast: Secondary | ICD-10-CM

## 2013-12-12 DIAGNOSIS — L989 Disorder of the skin and subcutaneous tissue, unspecified: Secondary | ICD-10-CM | POA: Insufficient documentation

## 2013-12-12 DIAGNOSIS — E7800 Pure hypercholesterolemia, unspecified: Secondary | ICD-10-CM

## 2013-12-12 DIAGNOSIS — M8580 Other specified disorders of bone density and structure, unspecified site: Secondary | ICD-10-CM

## 2013-12-12 DIAGNOSIS — E559 Vitamin D deficiency, unspecified: Secondary | ICD-10-CM

## 2013-12-12 LAB — CBC WITH DIFFERENTIAL/PLATELET
BASOS ABS: 0.1 10*3/uL (ref 0.0–0.1)
BASOS PCT: 1.1 % (ref 0.0–3.0)
EOS ABS: 0.3 10*3/uL (ref 0.0–0.7)
Eosinophils Relative: 4.9 % (ref 0.0–5.0)
HEMATOCRIT: 44.7 % (ref 36.0–46.0)
HEMOGLOBIN: 14.9 g/dL (ref 12.0–15.0)
Lymphocytes Relative: 34.2 % (ref 12.0–46.0)
Lymphs Abs: 1.8 10*3/uL (ref 0.7–4.0)
MCHC: 33.4 g/dL (ref 30.0–36.0)
MCV: 91.9 fl (ref 78.0–100.0)
Monocytes Absolute: 0.5 10*3/uL (ref 0.1–1.0)
Monocytes Relative: 9.4 % (ref 3.0–12.0)
NEUTROS ABS: 2.6 10*3/uL (ref 1.4–7.7)
Neutrophils Relative %: 50.4 % (ref 43.0–77.0)
Platelets: 240 10*3/uL (ref 150.0–400.0)
RBC: 4.86 Mil/uL (ref 3.87–5.11)
RDW: 12.7 % (ref 11.5–14.6)
WBC: 5.1 10*3/uL (ref 4.5–10.5)

## 2013-12-12 LAB — COMPREHENSIVE METABOLIC PANEL
ALT: 34 U/L (ref 0–35)
AST: 29 U/L (ref 0–37)
Albumin: 3.8 g/dL (ref 3.5–5.2)
Alkaline Phosphatase: 65 U/L (ref 39–117)
BILIRUBIN TOTAL: 0.8 mg/dL (ref 0.3–1.2)
BUN: 22 mg/dL (ref 6–23)
CO2: 29 mEq/L (ref 19–32)
Calcium: 9.4 mg/dL (ref 8.4–10.5)
Chloride: 105 mEq/L (ref 96–112)
Creatinine, Ser: 0.6 mg/dL (ref 0.4–1.2)
GFR: 107.23 mL/min (ref >60.00)
Glucose, Bld: 85 mg/dL (ref 70–99)
Potassium: 4.6 mEq/L (ref 3.5–5.1)
Sodium: 141 mEq/L (ref 135–145)
Total Protein: 6.5 g/dL (ref 6.0–8.3)

## 2013-12-12 LAB — LIPID PANEL
Cholesterol: 237 mg/dL — ABNORMAL HIGH (ref 0–200)
HDL: 96.3 mg/dL (ref >39.00)
LDL Cholesterol: 133 mg/dL — ABNORMAL HIGH (ref 0–99)
TRIGLYCERIDES: 40 mg/dL (ref 0.0–149.0)
Total CHOL/HDL Ratio: 2
VLDL: 8 mg/dL (ref 0.0–40.0)

## 2013-12-12 LAB — TSH: TSH: 1.94 u[IU]/mL (ref 0.35–5.50)

## 2013-12-12 NOTE — Progress Notes (Signed)
Subjective:    Patient ID: Vickie Crawford, female    DOB: [birth year], 68 y.o.   MRN: [id number]  HPI 68 year old female with past history of allergy problems and hypercholesterolemia who comes in today to follow up on these issues as well as for a complete physical exam.  She states she has been doing relatively well.  Stays active.   No problems with acid reflux reported.   Bowels stable. No abdominal pain or cramping.  Had her eyes checked - Dr Oren BracketSydnor following.  Has glaucoma.   Some allergies.  Discussed using Flonase.  Overall she feels she is doing well.  She does have a lesion on her left shoulder and right clavicle.  Would like evaluated.     Past Medical History  Diagnosis Date  . Ovarian cyst     Hospitalized 1969 and 1974 for removal  . Allergy   . Hypercholesterolemia   . Thyroid nodule   . Vitamin D deficiency     Current Outpatient Prescriptions on File Prior to Visit  Medication Sig Dispense Refill  . Calcium Carbonate-Vitamin D (CALTRATE 600+D PO) Take 1 tablet by mouth 2 (two) times daily.      . cholecalciferol (VITAMIN D) 1000 UNITS tablet Take 1,000 Units by mouth daily.      . fexofenadine (ALLEGRA) 180 MG tablet Take 180 mg by mouth daily.      . fluticasone (FLONASE) 50 MCG/ACT nasal spray Place 2 sprays into the nose daily.  16 g  6  . meloxicam (MOBIC) 7.5 MG tablet TAKE 1 TABLET (7.5 MG TOTAL) BY MOUTH DAILY.  30 tablet  1  . pantoprazole (PROTONIX) 40 MG tablet Take 1 tablet (40 mg total) by mouth daily.  30 tablet  5  . pyridOXINE (VITAMIN B-6) 100 MG tablet Take 100 mg by mouth daily.      . simvastatin (ZOCOR) 10 MG tablet TAKE 1 TABLET (10 MG TOTAL) BY MOUTH DAILY.  30 tablet  5   No current facility-administered medications on file prior to visit.    Review of Systems Patient denies any headache, lightheadedness or dizziness.  Some allergy issues.  No chest pain, tightness or palpitations.  No increased shortness of breath, cough or congestion.  No acid  reflux.  No swallowing issues reported.  No nausea or vomiting.  No abdominal pain or cramping.  No bowel change.      Objective:   Physical Exam  Filed Vitals:   12/09/13 1559  BP: 130/80  Pulse: 85  Temp: 98.1 F (7536.457 C)   68 year old female in no acute distress.   HEENT:  Nares- clear.  Oropharynx - without lesions. NECK:  Supple.  Nontender.  No audible bruit.  HEART:  Appears to be regular. LUNGS:  No crackles or wheezing audible.  Respirations even and unlabored.  RADIAL PULSE:  Equal bilaterally.    BREASTS:  No nipple discharge or nipple retraction present.  Could not appreciate any distinct nodules or axillary adenopathy.  ABDOMEN:  Soft, nontender.  Bowel sounds present and normal.  No audible abdominal bruit.  GU:  Not performed.     EXTREMITIES:  No increased edema present.  DP pulses palpable and equal bilaterally.          Assessment & Plan:  SWALLOWING DIFFICULTY.  On protonix and doing well.  Follow.  No acid reflux.     CARDIOVASCULAR.  Very active.  No cardiac symptoms with increased activity or exertion.  MSK.  Doing well with Mobic.  Follow.  Monitor renal function and for GI side effects.  States she needs the mobic.  Discussed possible side effects and risk of medication.   ELEVATED BLOOD PRESSURE.  Blood pressure as outlined.  Follow.    HEALTH MAINTENANCE.  Physical today.   Colonoscopy 10/24/06 - two 4mm polyps in the sigmoid - hyperplastic.  Recommended follow up colonoscopy 2018.  Mammogram 10/28/12 - Birads II.  Schedule a f/u mammogram.

## 2013-12-12 NOTE — Assessment & Plan Note (Signed)
Low cholesterol diet and exercise.  Follow lipid panel.   

## 2013-12-12 NOTE — Assessment & Plan Note (Signed)
Has a persistent skin lesion on her posterior shoulder (left) and right clavicle.  Will refer to Dr Roseanne KaufmanIsenstein for further evaluation and treatment.

## 2013-12-12 NOTE — Telephone Encounter (Signed)
Order placed for labs.

## 2013-12-12 NOTE — Assessment & Plan Note (Signed)
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  Tiny hypoechoic nodules in each lobe with no associated microcalcifications.  Follow thyroid function.  May need follow up thyroid ultrasound.  She has declined previously. Follow.

## 2013-12-12 NOTE — Telephone Encounter (Signed)
Please advise as no referral is in for Dermatology or a mammo

## 2013-12-12 NOTE — Assessment & Plan Note (Signed)
Resolved

## 2013-12-12 NOTE — Telephone Encounter (Signed)
Orders placed for mammo and dermatology appt.

## 2013-12-12 NOTE — Telephone Encounter (Signed)
What labs and dx?  

## 2013-12-12 NOTE — Assessment & Plan Note (Signed)
Bone density 2007 - improved.  Continue calcium and vitamin D. Have discussed f/u bone density.

## 2013-12-12 NOTE — Assessment & Plan Note (Signed)
Continue replacement.  Follow vitamin d level.

## 2013-12-12 NOTE — Telephone Encounter (Signed)
States she was seen on 4/14 and is waiting for referral for Dr. Isa RankinEisenstein for a place on her shoulder.  States an afternoon on Tuesday or Thursday would be best.  Wednesday she could go any time of day.    Wanted to have that looked at next week as she will be going out of town.  Also waiting on mammogram appt, [place].

## 2013-12-13 LAB — VITAMIN D 25 HYDROXY (VIT D DEFICIENCY, FRACTURES): Vit D, 25-Hydroxy: 65 ng/mL (ref 30–89)

## 2013-12-15 ENCOUNTER — Encounter: Payer: Self-pay | Admitting: *Deleted

## 2013-12-15 NOTE — Telephone Encounter (Signed)
Pt left vm.  Returning Amber's call.

## 2013-12-15 NOTE — Telephone Encounter (Signed)
Pt aware that [facility] cannot see her until July, they have placed her on a cancellation list incase something comes open sooner.

## 2013-12-15 NOTE — Telephone Encounter (Signed)
Pt aware of apt.  

## 2013-12-17 ENCOUNTER — Encounter: Payer: Self-pay | Admitting: Emergency Medicine

## 2013-12-31 ENCOUNTER — Other Ambulatory Visit: Payer: Self-pay | Admitting: Internal Medicine

## 2014-02-01 ENCOUNTER — Other Ambulatory Visit: Payer: Self-pay | Admitting: Internal Medicine

## 2014-02-04 ENCOUNTER — Ambulatory Visit: Payer: Self-pay | Admitting: Internal Medicine

## 2014-02-04 LAB — HM MAMMOGRAPHY: HM MAMMO: NEGATIVE

## 2014-02-05 ENCOUNTER — Encounter: Payer: Self-pay | Admitting: Internal Medicine

## 2014-04-06 ENCOUNTER — Other Ambulatory Visit: Payer: Self-pay | Admitting: Internal Medicine

## 2014-04-07 NOTE — Telephone Encounter (Signed)
Refilled Mobic #30 with 2 refills.

## 2014-04-07 NOTE — Telephone Encounter (Signed)
Last refill 7.13.15, last OV 4.14.15, next OV 10.22.15.  Please advise refill

## 2014-06-18 ENCOUNTER — Encounter: Payer: Self-pay | Admitting: Internal Medicine

## 2014-06-18 ENCOUNTER — Ambulatory Visit: Payer: Federal, State, Local not specified - PPO | Admitting: Internal Medicine

## 2014-06-18 ENCOUNTER — Ambulatory Visit (INDEPENDENT_AMBULATORY_CARE_PROVIDER_SITE_OTHER): Payer: Federal, State, Local not specified - PPO | Admitting: Internal Medicine

## 2014-06-18 VITALS — BP 130/80 | HR 90 | Temp 98.6°F | Ht 60.25 in | Wt 137.8 lb

## 2014-06-18 DIAGNOSIS — M8580 Other specified disorders of bone density and structure, unspecified site: Secondary | ICD-10-CM

## 2014-06-18 DIAGNOSIS — E041 Nontoxic single thyroid nodule: Secondary | ICD-10-CM

## 2014-06-18 DIAGNOSIS — E7800 Pure hypercholesterolemia, unspecified: Secondary | ICD-10-CM

## 2014-06-18 DIAGNOSIS — E559 Vitamin D deficiency, unspecified: Secondary | ICD-10-CM

## 2014-06-18 DIAGNOSIS — Z23 Encounter for immunization: Secondary | ICD-10-CM

## 2014-06-18 NOTE — Progress Notes (Signed)
Pre visit review using our clinic review tool, if applicable. No additional management support is needed unless otherwise documented below in the visit note. 

## 2014-06-23 ENCOUNTER — Telehealth: Payer: Self-pay | Admitting: *Deleted

## 2014-06-23 DIAGNOSIS — Z79899 Other long term (current) drug therapy: Secondary | ICD-10-CM

## 2014-06-23 DIAGNOSIS — E7800 Pure hypercholesterolemia, unspecified: Secondary | ICD-10-CM

## 2014-06-23 NOTE — Telephone Encounter (Signed)
What labs and dx?  

## 2014-06-24 ENCOUNTER — Other Ambulatory Visit (INDEPENDENT_AMBULATORY_CARE_PROVIDER_SITE_OTHER): Payer: Federal, State, Local not specified - PPO

## 2014-06-24 DIAGNOSIS — E7800 Pure hypercholesterolemia, unspecified: Secondary | ICD-10-CM

## 2014-06-24 DIAGNOSIS — Z79899 Other long term (current) drug therapy: Secondary | ICD-10-CM

## 2014-06-24 LAB — COMPREHENSIVE METABOLIC PANEL
ALT: 29 U/L (ref 0–35)
AST: 27 U/L (ref 0–37)
Albumin: 3.4 g/dL — ABNORMAL LOW (ref 3.5–5.2)
Alkaline Phosphatase: 65 U/L (ref 39–117)
BUN: 20 mg/dL (ref 6–23)
CALCIUM: 9.2 mg/dL (ref 8.4–10.5)
CHLORIDE: 105 meq/L (ref 96–112)
CO2: 28 mEq/L (ref 19–32)
CREATININE: 0.7 mg/dL (ref 0.4–1.2)
GFR: 92.45 mL/min (ref >60.00)
GLUCOSE: 84 mg/dL (ref 70–99)
Potassium: 4.3 mEq/L (ref 3.5–5.1)
Sodium: 141 mEq/L (ref 135–145)
Total Bilirubin: 0.7 mg/dL (ref 0.2–1.2)
Total Protein: 6.3 g/dL (ref 6.0–8.3)

## 2014-06-24 LAB — LIPID PANEL
Cholesterol: 212 mg/dL — ABNORMAL HIGH (ref 0–200)
HDL: 91.5 mg/dL (ref >39.00)
LDL Cholesterol: 112 mg/dL — ABNORMAL HIGH (ref 0–99)
NONHDL: 120.5
TRIGLYCERIDES: 43 mg/dL (ref 0.0–149.0)
Total CHOL/HDL Ratio: 2
VLDL: 8.6 mg/dL (ref 0.0–40.0)

## 2014-06-24 NOTE — Telephone Encounter (Signed)
Labs ordered.

## 2014-06-25 ENCOUNTER — Encounter: Payer: Self-pay | Admitting: *Deleted

## 2014-06-28 ENCOUNTER — Encounter: Payer: Self-pay | Admitting: Internal Medicine

## 2014-06-28 NOTE — Progress Notes (Signed)
Subjective:    Patient ID: Vickie Crawford, female    DOB: 02-15-1946, 68 y.o.   MRN: [id number]  HPI 68 year old female with past history of allergy problems and hypercholesterolemia who comes in today for a scheduled follow up.  She states she has been doing relatively well.  Stays active.   No problems with acid reflux reported.   protonix controls.  Bowels stable. No abdominal pain or cramping.  Had her eyes checked - Dr Oren BracketSydnor following.  Has glaucoma.   Overall she feels she is doing well.  Discussed f/u thyroid ultrasound.  She declines.     Past Medical History  Diagnosis Date  . Ovarian cyst     Hospitalized 1969 and 1974 for removal  . Allergy   . Hypercholesterolemia   . Thyroid nodule   . Vitamin D deficiency     Current Outpatient Prescriptions on File Prior to Visit  Medication Sig Dispense Refill  . Calcium Carbonate-Vitamin D (CALTRATE 600+D PO) Take 1 tablet by mouth 2 (two) times daily.    . cholecalciferol (VITAMIN D) 1000 UNITS tablet Take 1,000 Units by mouth daily.    . fexofenadine (ALLEGRA) 180 MG tablet Take 180 mg by mouth daily.    . fluticasone (FLONASE) 50 MCG/ACT nasal spray Place 2 sprays into the nose daily. 16 g 6  . meloxicam (MOBIC) 7.5 MG tablet TAKE 1 TABLET BY MOUTH EVERY DAY AS NEEDED 30 tablet 2  . pantoprazole (PROTONIX) 40 MG tablet Take 1 tablet (40 mg total) by mouth daily. 30 tablet 5  . pyridOXINE (VITAMIN B-6) 100 MG tablet Take 100 mg by mouth daily.    . simvastatin (ZOCOR) 10 MG tablet TAKE 1 TABLET BY MOUTH AT BEDTIME 30 tablet 5   No current facility-administered medications on file prior to visit.    Review of Systems Patient denies any headache, lightheadedness or dizziness.  No significant allergy or sinus issues.   No chest pain, tightness or palpitations.  No increased shortness of breath, cough or congestion.  No acid reflux.  No swallowing issues reported.  Doing well on protonix.   No nausea or vomiting.  No abdominal pain or  cramping.  No bowel change.      Objective:   Physical Exam  Filed Vitals:   06/18/14 1606  BP: 130/80  Pulse: 90  Temp: 98.6 F (37 C)   Blood pressure recheck:  33124/4678  68 year old female in no acute distress.   HEENT:  Nares- clear.  Oropharynx - without lesions. NECK:  Supple.  Nontender.  No audible bruit.  HEART:  Appears to be regular. LUNGS:  No crackles or wheezing audible.  Respirations even and unlabored.  RADIAL PULSE:  Equal bilaterally.  ABDOMEN:  Soft, nontender.  Bowel sounds present and normal.  No audible abdominal bruit.  EXTREMITIES:  No increased edema present.  DP pulses palpable and equal bilaterally.          Assessment & Plan:  PREVIOUS SWALLOWING DIFFICULTY.  On protonix and doing well.  Follow.  No acid reflux.     CARDIOVASCULAR.  Very active.  No cardiac symptoms with increased activity or exertion.   MSK.  Doing well with Mobic.  Follow.  Monitor renal function and for GI side effects.  States she needs the mobic.  again discussed possible side effects and risk of medication.   ELEVATED BLOOD PRESSURE.  Blood pressure doing well.  Follow.   Encounter for immunization Flu  shot given today.   Nontoxic uninodular goiter Ultrasound of the thyroid revealed thyroid lobes to be wnl.  Tiny hypoechoic nodules in each lobe with no associated microcalcifications.  Follow thyroid function.  Discussed f/u ultrasound.  She continues to decline.    Osteopenia Last bone density improved.  Continue weight bearing exercise and vitamin D.  Will let me know when agreeable for f/u bone density.    Pure hypercholesterolemia Check cholesterol panel.  Have discussed low cholesterol diet and exercise.  On simvastatin.  Check liver panel.    Lab Results  Component Value Date   CHOL 212* 06/24/2014   HDL 91.50 06/24/2014   LDLCALC 112* 06/24/2014   TRIG 43.0 06/24/2014   CHOLHDL 2 06/24/2014    Vitamin D deficiency Continue vitamin D supplements.  Vitamin D  level 12/12/13 - 65.   HEALTH MAINTENANCE.  Physical 12/12/13.   Colonoscopy 10/24/06 - two 4mm polyps in the sigmoid - hyperplastic.  Recommended follow up colonoscopy 2018.  Mammogram 02/04/14 - Birads I.

## 2014-07-05 ENCOUNTER — Other Ambulatory Visit: Payer: Self-pay | Admitting: Internal Medicine

## 2014-07-14 ENCOUNTER — Other Ambulatory Visit: Payer: Self-pay | Admitting: Internal Medicine

## 2014-09-16 ENCOUNTER — Other Ambulatory Visit: Payer: Self-pay

## 2014-09-16 MED ORDER — FLUTICASONE PROPIONATE 50 MCG/ACT NA SUSP: 2.0000 | Freq: Every day | NASAL | Status: DC

## 2014-10-09 ENCOUNTER — Other Ambulatory Visit: Payer: Self-pay | Admitting: Internal Medicine

## 2014-11-27 ENCOUNTER — Other Ambulatory Visit: Payer: Self-pay | Admitting: *Deleted

## 2014-11-27 ENCOUNTER — Telehealth: Payer: Self-pay | Admitting: *Deleted

## 2014-11-27 MED ORDER — SIMVASTATIN 10 MG PO TABS: 10.0000 mg | ORAL_TABLET | Freq: Every day | ORAL | Status: DC

## 2014-11-27 MED ORDER — MELOXICAM 7.5 MG PO TABS: 7.5000 mg | ORAL_TABLET | Freq: Every day | ORAL | Status: DC | PRN

## 2014-11-27 NOTE — Telephone Encounter (Signed)
rx ok'd for meloxicam #90 with no refills.   

## 2014-11-27 NOTE — Telephone Encounter (Signed)
Fax from pharmacy requesting Meloxicam 7.5 mg x90 day.  Last OV 10.22.15 last refill 2.12.16.  Please advise refill

## 2014-12-21 ENCOUNTER — Ambulatory Visit (INDEPENDENT_AMBULATORY_CARE_PROVIDER_SITE_OTHER): Payer: Federal, State, Local not specified - PPO | Admitting: Internal Medicine

## 2014-12-21 ENCOUNTER — Encounter: Payer: Self-pay | Admitting: Internal Medicine

## 2014-12-21 VITALS — BP 130/80 | HR 79 | Temp 98.2°F | Ht 61.0 in | Wt 137.4 lb

## 2014-12-21 DIAGNOSIS — M8580 Other specified disorders of bone density and structure, unspecified site: Secondary | ICD-10-CM

## 2014-12-21 DIAGNOSIS — Z1211 Encounter for screening for malignant neoplasm of colon: Secondary | ICD-10-CM

## 2014-12-21 DIAGNOSIS — Z23 Encounter for immunization: Secondary | ICD-10-CM | POA: Diagnosis not present

## 2014-12-21 DIAGNOSIS — Z0000 Encounter for general adult medical examination without abnormal findings: Secondary | ICD-10-CM

## 2014-12-21 DIAGNOSIS — E7800 Pure hypercholesterolemia, unspecified: Secondary | ICD-10-CM

## 2014-12-21 DIAGNOSIS — E559 Vitamin D deficiency, unspecified: Secondary | ICD-10-CM

## 2014-12-21 DIAGNOSIS — E041 Nontoxic single thyroid nodule: Secondary | ICD-10-CM

## 2014-12-21 NOTE — Progress Notes (Signed)
Pre visit review using our clinic review tool, if applicable. No additional management support is needed unless otherwise documented below in the visit note. 

## 2014-12-24 ENCOUNTER — Telehealth: Payer: Self-pay | Admitting: *Deleted

## 2014-12-24 DIAGNOSIS — M8580 Other specified disorders of bone density and structure, unspecified site: Secondary | ICD-10-CM

## 2014-12-24 DIAGNOSIS — E559 Vitamin D deficiency, unspecified: Secondary | ICD-10-CM

## 2014-12-24 DIAGNOSIS — E7800 Pure hypercholesterolemia, unspecified: Secondary | ICD-10-CM

## 2014-12-24 NOTE — Telephone Encounter (Signed)
Labs and dx?  

## 2014-12-24 NOTE — Telephone Encounter (Signed)
Order placed for labs.

## 2014-12-25 ENCOUNTER — Other Ambulatory Visit (INDEPENDENT_AMBULATORY_CARE_PROVIDER_SITE_OTHER): Payer: Federal, State, Local not specified - PPO

## 2014-12-25 DIAGNOSIS — M8580 Other specified disorders of bone density and structure, unspecified site: Secondary | ICD-10-CM | POA: Diagnosis not present

## 2014-12-25 DIAGNOSIS — E559 Vitamin D deficiency, unspecified: Secondary | ICD-10-CM

## 2014-12-25 DIAGNOSIS — E7800 Pure hypercholesterolemia, unspecified: Secondary | ICD-10-CM

## 2014-12-25 LAB — COMPREHENSIVE METABOLIC PANEL
ALBUMIN: 4 g/dL (ref 3.5–5.2)
ALT: 28 U/L (ref 0–35)
AST: 28 U/L (ref 0–37)
Alkaline Phosphatase: 60 U/L (ref 39–117)
BUN: 21 mg/dL (ref 6–23)
CO2: 28 mEq/L (ref 19–32)
Calcium: 9.2 mg/dL (ref 8.4–10.5)
Chloride: 106 mEq/L (ref 96–112)
Creatinine, Ser: 0.66 mg/dL (ref 0.40–1.20)
GFR: 93.93 mL/min (ref >60.00)
GLUCOSE: 97 mg/dL (ref 70–99)
Potassium: 4.4 mEq/L (ref 3.5–5.1)
SODIUM: 139 meq/L (ref 135–145)
Total Bilirubin: 0.6 mg/dL (ref 0.2–1.2)
Total Protein: 6.5 g/dL (ref 6.0–8.3)

## 2014-12-25 LAB — LIPID PANEL
CHOLESTEROL: 195 mg/dL (ref 0–200)
HDL: 91.6 mg/dL (ref >39.00)
LDL Cholesterol: 94 mg/dL (ref 0–99)
NONHDL: 103.4
TRIGLYCERIDES: 45 mg/dL (ref 0.0–149.0)
Total CHOL/HDL Ratio: 2
VLDL: 9 mg/dL (ref 0.0–40.0)

## 2014-12-25 LAB — CBC WITH DIFFERENTIAL/PLATELET
BASOS ABS: 0 10*3/uL (ref 0.0–0.1)
BASOS PCT: 1 % (ref 0.0–3.0)
EOS ABS: 0.2 10*3/uL (ref 0.0–0.7)
EOS PCT: 3.9 % (ref 0.0–5.0)
HCT: 44.6 % (ref 36.0–46.0)
HEMOGLOBIN: 15 g/dL (ref 12.0–15.0)
LYMPHS ABS: 1.7 10*3/uL (ref 0.7–4.0)
LYMPHS PCT: 36.3 % (ref 12.0–46.0)
MCHC: 33.7 g/dL (ref 30.0–36.0)
MCV: 89.4 fl (ref 78.0–100.0)
MONOS PCT: 9.4 % (ref 3.0–12.0)
Monocytes Absolute: 0.4 10*3/uL (ref 0.1–1.0)
NEUTROS PCT: 49.4 % (ref 43.0–77.0)
Neutro Abs: 2.2 10*3/uL (ref 1.4–7.7)
Platelets: 245 10*3/uL (ref 150.0–400.0)
RBC: 4.99 Mil/uL (ref 3.87–5.11)
RDW: 12.9 % (ref 11.5–15.5)
WBC: 4.6 10*3/uL (ref 4.0–10.5)

## 2014-12-25 LAB — VITAMIN D 25 HYDROXY (VIT D DEFICIENCY, FRACTURES): VITD: 58.77 ng/mL (ref 30.00–100.00)

## 2014-12-25 LAB — TSH: TSH: 1.66 u[IU]/mL (ref 0.35–4.50)

## 2014-12-29 ENCOUNTER — Encounter: Payer: Self-pay | Admitting: *Deleted

## 2014-12-29 ENCOUNTER — Encounter: Payer: Self-pay | Admitting: Internal Medicine

## 2014-12-29 ENCOUNTER — Other Ambulatory Visit (INDEPENDENT_AMBULATORY_CARE_PROVIDER_SITE_OTHER): Payer: Federal, State, Local not specified - PPO

## 2014-12-29 DIAGNOSIS — Z0000 Encounter for general adult medical examination without abnormal findings: Secondary | ICD-10-CM | POA: Insufficient documentation

## 2014-12-29 DIAGNOSIS — Z1211 Encounter for screening for malignant neoplasm of colon: Secondary | ICD-10-CM

## 2014-12-29 LAB — FECAL OCCULT BLOOD, IMMUNOCHEMICAL: Fecal Occult Bld: NEGATIVE

## 2014-12-29 NOTE — Assessment & Plan Note (Signed)
Previous ultrasound revealed thyroid lobes to be wnl.  Tiny hypoechoic nodules in each lobe with no associated microcalcifications.  Follow thyroid function.  Has previously declined thyroid ultrasound. Follow.  D/w Vickie Crawford more.

## 2014-12-29 NOTE — Assessment & Plan Note (Signed)
Calcium and vitamin D.  Weight bearing exercise.  Follow bone density.

## 2014-12-29 NOTE — Assessment & Plan Note (Signed)
Physical 12/21/14.  Colonoscopy 10/24/06.  Recommended f/u colonoscopy in 2018.  Mammogram 02/04/14 - Birads I.

## 2014-12-29 NOTE — Assessment & Plan Note (Signed)
Low cholesterol diet and exercise.  On simvastatin.  Follow lipid panel and liver function tests.   

## 2014-12-29 NOTE — Assessment & Plan Note (Signed)
Continue vitamin D supplements.  Follow vitamin D level.   

## 2014-12-29 NOTE — Progress Notes (Signed)
Patient ID: Vickie Crawford, female   DOB: 1946/05/01, 69 y.o.   MRN: [id number]   Subjective:    Patient ID: Vickie Crawford, female    DOB: [birth year], 69 y.o.   MRN: [id number]  HPI  Patient here for her physical exam.  Has seen opthalmology recently.  Increased pressure left eye.  Treating.  Sty - left eye.  Using compresses and drops.  No change.  Plans to f/u with opthalmology. Stays active.  No cardiac symptoms with increased activity or exertion.  Breathing stable.  Eating and drinking well.  Bowels stable.    Past Medical History  Diagnosis Date  . Ovarian cyst     Hospitalized 1969 and 1974 for removal  . Allergy   . Hypercholesterolemia   . Thyroid nodule   . Vitamin D deficiency     Current Outpatient Prescriptions on File Prior to Visit  Medication Sig Dispense Refill  . Calcium Carbonate-Vitamin D (CALTRATE 600+D PO) Take 1 tablet by mouth 2 (two) times daily.    . cholecalciferol (VITAMIN D) 1000 UNITS tablet Take 1,000 Units by mouth daily.    . fexofenadine (ALLEGRA) 180 MG tablet Take 180 mg by mouth daily.    . fluticasone (FLONASE) 50 MCG/ACT nasal spray Place 2 sprays into both nostrils daily. 16 g 5  . meloxicam (MOBIC) 7.5 MG tablet Take 1 tablet (7.5 mg total) by mouth daily as needed. 90 tablet 0  . pantoprazole (PROTONIX) 40 MG tablet Take 1 tablet (40 mg total) by mouth daily. 30 tablet 5  . pyridOXINE (VITAMIN B-6) 100 MG tablet Take 100 mg by mouth daily.    . simvastatin (ZOCOR) 10 MG tablet Take 1 tablet (10 mg total) by mouth at bedtime. 30 tablet 5   No current facility-administered medications on file prior to visit.    Review of Systems  Constitutional: Negative for appetite change and unexpected weight change.  HENT: Negative for congestion and sinus pressure.   Eyes: Negative for visual disturbance.       Increased eye pressure.  Sty present.   Respiratory: Negative for cough, chest tightness and shortness of breath.   Cardiovascular: Negative for  chest pain, palpitations and leg swelling.  Gastrointestinal: Negative for nausea, vomiting, abdominal pain and diarrhea.  Genitourinary: Negative for dysuria and difficulty urinating.  Musculoskeletal: Negative for back pain and joint swelling.  Skin: Negative for color change and rash.  Neurological: Negative for dizziness, light-headedness and headaches.  Hematological: Negative for adenopathy. Does not bruise/bleed easily.  Psychiatric/Behavioral: Negative for dysphoric mood and agitation.       Objective:     Blood pressure recheck:  122/78  Physical Exam  Constitutional: She is oriented to person, place, and time. She appears well-developed and well-nourished.  HENT:  Nose: Nose normal.  Mouth/Throat: Oropharynx is clear and moist.  Eyes: Right eye exhibits no discharge. Left eye exhibits no discharge. No scleral icterus.  Neck: Neck supple. No thyromegaly present.  Cardiovascular: Normal rate and regular rhythm.   Pulmonary/Chest: Breath sounds normal. No accessory muscle usage. No tachypnea. No respiratory distress. She has no decreased breath sounds. She has no wheezes. She has no rhonchi. Right breast exhibits no inverted nipple, no mass, no nipple discharge and no tenderness (no axillary adenopathy). Left breast exhibits no inverted nipple, no mass, no nipple discharge and no tenderness (no axilarry adenopathy).  Abdominal: Soft. Bowel sounds are normal. There is no tenderness.  Musculoskeletal: She exhibits no edema or tenderness.  Lymphadenopathy:    She has no cervical adenopathy.  Neurological: She is alert and oriented to person, place, and time.  Skin: Skin is warm. No rash noted.  Psychiatric: She has a normal mood and affect. Her behavior is normal.    BP 130/80 mmHg  Pulse 79  Temp(Src) 98.2 F (36.8 C) (Oral)  Ht 5\' 1"  (1.549 m)  Wt 137 lb 6 oz (62.313 kg)  BMI 25.97 kg/m2  SpO2 94% Wt Readings from Last 3 Encounters:  12/21/14 137 lb 6 oz (62.313 kg)    06/18/14 137 lb 12 oz (62.483 kg)  12/09/13 143 lb 12 oz (65.205 kg)     Lab Results  Component Value Date   WBC 4.6 12/25/2014   HGB 15.0 12/25/2014   HCT 44.6 12/25/2014   PLT 245.0 12/25/2014   GLUCOSE 97 12/25/2014   CHOL 195 12/25/2014   TRIG 45.0 12/25/2014   HDL 91.60 12/25/2014   LDLCALC 94 12/25/2014   ALT 28 12/25/2014   AST 28 12/25/2014   NA 139 12/25/2014   K 4.4 12/25/2014   CL 106 12/25/2014   CREATININE 0.66 12/25/2014   BUN 21 12/25/2014   CO2 28 12/25/2014   TSH 1.66 12/25/2014       Assessment & Plan:   Problem List Items Addressed This Visit    Health care maintenance    Physical 12/21/14.  Colonoscopy 10/24/06.  Recommended f/u colonoscopy in 2018.  Mammogram 02/04/14 - Birads I.        Nontoxic uninodular goiter    Previous ultrasound revealed thyroid lobes to be wnl.  Tiny hypoechoic nodules in each lobe with no associated microcalcifications.  Follow thyroid function.  Has previously declined thyroid ultrasound. Follow.  D/w her more.        Osteopenia    Calcium and vitamin D.  Weight bearing exercise.  Follow bone density.       Pure hypercholesterolemia    Low cholesterol diet and exercise.  On simvastatin.  Follow lipid panel and liver function tests.       Vitamin D deficiency    Continue vitamin D supplements.  Follow vitamin D level.        Other Visit Diagnoses    Colon cancer screening    -  Primary    Relevant Orders    Fecal occult blood, imunochemical    Need for prophylactic vaccination against Streptococcus pneumoniae (pneumococcus)        Relevant Orders    Pneumococcal conjugate vaccine 13-valent (Completed)      I spent 25 minutes with the patient and more than 50% of the time was spent in consultation regarding the above.     Dale DurhamSCOTT, Vandy Tsuchiya, MD

## 2015-02-26 ENCOUNTER — Other Ambulatory Visit: Payer: Self-pay | Admitting: Internal Medicine

## 2015-02-26 NOTE — Telephone Encounter (Signed)
Last visit 12/21/14, last refill 11/27/14 #90

## 2015-02-27 NOTE — Telephone Encounter (Signed)
Refilled meloxicam #90 with one refill.   

## 2015-06-24 ENCOUNTER — Encounter: Payer: Self-pay | Admitting: Internal Medicine

## 2015-06-24 ENCOUNTER — Ambulatory Visit (INDEPENDENT_AMBULATORY_CARE_PROVIDER_SITE_OTHER): Payer: Federal, State, Local not specified - PPO | Admitting: Internal Medicine

## 2015-06-24 VITALS — BP 110/80 | HR 79 | Temp 98.3°F | Resp 18 | Ht 61.0 in | Wt 133.0 lb

## 2015-06-24 DIAGNOSIS — M8580 Other specified disorders of bone density and structure, unspecified site: Secondary | ICD-10-CM | POA: Diagnosis not present

## 2015-06-24 DIAGNOSIS — E559 Vitamin D deficiency, unspecified: Secondary | ICD-10-CM

## 2015-06-24 DIAGNOSIS — Z1239 Encounter for other screening for malignant neoplasm of breast: Secondary | ICD-10-CM

## 2015-06-24 DIAGNOSIS — Z23 Encounter for immunization: Secondary | ICD-10-CM

## 2015-06-24 DIAGNOSIS — E7800 Pure hypercholesterolemia, unspecified: Secondary | ICD-10-CM

## 2015-06-24 DIAGNOSIS — E041 Nontoxic single thyroid nodule: Secondary | ICD-10-CM | POA: Diagnosis not present

## 2015-06-24 DIAGNOSIS — Z1211 Encounter for screening for malignant neoplasm of colon: Secondary | ICD-10-CM

## 2015-06-24 NOTE — Progress Notes (Signed)
Patient ID: Vickie Crawford, female   DOB: 10-05-1945, 69 y.o.   MRN: [id number]   Subjective:    Patient ID: Vickie Crawford, female    DOB: 10-05-1945, 69 y.o.   MRN: [id number]  HPI  Patient with past history of tyroid nodule, vitamin D deficiency and hypercholesterolemia who comes in today to follow up on these issues.  She is staying active.  Still exercising.  No chest pain or tightness.  No sob.  No significant acid reflux unless she eats late or has wine.  If she avoid these - no problems.  No nausea or vomiting.  No bowel change.  No swallowing problems.  Declines f/u ultrasound of her thyroid.  Son is getting married soon.     Past Medical History  Diagnosis Date  . Ovarian cyst     Hospitalized 1969 and 1974 for removal  . Allergy   . Hypercholesterolemia   . Thyroid nodule   . Vitamin D deficiency    Past Surgical History  Procedure Laterality Date  . Ovarian cyst removal      1969 and 1974   Family History  Problem Relation Age of Onset  . Hypertension Mother   . Heart disease Mother   . Stroke Father   . Brain cancer Sister    Social History   Social History  . Marital Status: Unknown    Spouse Name: N/A  . Number of Children: 2  . Years of Education: N/A   Social History Main Topics  . Smoking status: Former Smoker    Types: Cigarettes  . Smokeless tobacco: Never Used  . Alcohol Use: 0.0 oz/week    0 Standard drinks or equivalent per week  . Drug Use: No  . Sexual Activity: Not Asked   Other Topics Concern  . None   Social History Narrative    Outpatient Encounter Prescriptions as of 06/24/2015  Medication Sig  . Calcium Carbonate-Vitamin D (CALTRATE 600+D PO) Take 1 tablet by mouth 2 (two) times daily.  . cholecalciferol (VITAMIN D) 1000 UNITS tablet Take 1,000 Units by mouth daily.  Marland Kitchen. desonide (DESOWEN) 0.05 % ointment at bedtime.  . fexofenadine (ALLEGRA) 180 MG tablet Take 180 mg by mouth daily.  . fluticasone (FLONASE) 50 MCG/ACT nasal spray Place  2 sprays into both nostrils daily.  Marland Kitchen. latanoprost (XALATAN) 0.005 % ophthalmic solution INSTILL ONE DROP IN BOTH EYES AT BEDTIME.  . meloxicam (MOBIC) 7.5 MG tablet TAKE 1 TABLET (7.5 MG TOTAL) BY MOUTH DAILY AS NEEDED.  Marland Kitchen. pantoprazole (PROTONIX) 40 MG tablet Take 1 tablet (40 mg total) by mouth daily.  Marland Kitchen. pyridOXINE (VITAMIN B-6) 100 MG tablet Take 100 mg by mouth daily.  . simvastatin (ZOCOR) 10 MG tablet Take 1 tablet (10 mg total) by mouth at bedtime.  . [DISCONTINUED] brimonidine (ALPHAGAN) 0.15 % ophthalmic solution Place 1 drop into both eyes 2 (two) times daily.  . [DISCONTINUED] timolol (TIMOPTIC) 0.5 % ophthalmic solution Place 1 drop into both eyes daily.   No facility-administered encounter medications on file as of 06/24/2015.    Review of Systems  Constitutional: Positive for fever. Negative for appetite change and unexpected weight change.       Has adjusted her diet.  Exercising.    HENT: Negative for congestion and sinus pressure.   Respiratory: Negative for cough, chest tightness and shortness of breath.   Cardiovascular: Negative for chest pain, palpitations and leg swelling.  Gastrointestinal: Negative for nausea, vomiting, abdominal pain and diarrhea.  Genitourinary: Negative for dysuria and difficulty urinating.  Musculoskeletal: Negative for back pain and joint swelling.  Skin: Negative for color change and rash.  Neurological: Negative for dizziness, light-headedness and headaches.  Psychiatric/Behavioral: Negative for dysphoric mood and agitation.       Objective:     Blood pressure rechecked by me:  122/78  Physical Exam  Constitutional: She appears well-developed and well-nourished. No distress.  HENT:  Nose: Nose normal.  Mouth/Throat: Oropharynx is clear and moist.  Eyes: Conjunctivae are normal. Right eye exhibits no discharge. Left eye exhibits no discharge.  Neck: Neck supple. No thyromegaly present.  Cardiovascular: Normal rate and regular rhythm.    Pulmonary/Chest: Breath sounds normal. No respiratory distress. She has no wheezes.  Abdominal: Soft. Bowel sounds are normal. There is no tenderness.  Musculoskeletal: She exhibits no edema or tenderness.  Lymphadenopathy:    She has no cervical adenopathy.  Skin: No rash noted. No erythema.  Psychiatric: She has a normal mood and affect. Her behavior is normal.    BP 110/80 mmHg  Pulse 79  Temp(Src) 98.3 F (36.8 C) (Oral)  Resp 18  Ht  (1.549 m)  Wt 133 lb (60.328 kg)  BMI 25.14 kg/m2  SpO2 95% Wt Readings from Last 3 Encounters:  06/24/15 133 lb (60.328 kg)  12/21/14 137 lb 6 oz (62.313 kg)  06/18/14 137 lb 12 oz (62.483 kg)     Lab Results  Component Value Date   WBC 4.6 12/25/2014   HGB 15.0 12/25/2014   HCT 44.6 12/25/2014   PLT 245.0 12/25/2014   GLUCOSE 89 06/25/2015   CHOL 204* 06/25/2015   TRIG 36.0 06/25/2015   HDL 104.10 06/25/2015   LDLCALC 93 06/25/2015   ALT 32 06/25/2015   AST 29 06/25/2015   NA 141 06/25/2015   K 4.2 06/25/2015   CL 105 06/25/2015   CREATININE 0.76 06/25/2015   BUN 22 06/25/2015   CO2 28 06/25/2015   TSH 1.65 06/25/2015       Assessment & Plan:   Problem List Items Addressed This Visit    Colon cancer screening    Colonoscopy 10/24/06.  Recommended f/u colonoscopy in 2018.        Nontoxic uninodular goiter    Previous ultrasound revealed tiny hypoechoic nodules in each lobe with no associated microcalcifications.  Discussed again with her today regarding f/u ultrasound.  She declines.  Follow thyroid function.        Relevant Orders   TSH (Completed)   Osteopenia - Primary    Continue calcium, vitamin D and weight bearing exercise.  Follow.       Pure hypercholesterolemia    On simvastatin.  Low cholesterol diet and exercise.  Follow lipid panel and liver function tests.        Relevant Orders   Hepatic function panel (Completed)   Lipid panel (Completed)   Basic metabolic panel (Completed)   Vitamin D  deficiency    Continue vitamin D supplements.  Follow vitamin D levels.         Other Visit Diagnoses    Screening breast examination        Relevant Orders    MM DIGITAL SCREENING BILATERAL    Encounter for immunization            Dale Chevak, MD

## 2015-06-24 NOTE — Progress Notes (Signed)
Pre-visit discussion using our clinic review tool. No additional management support is needed unless otherwise documented below in the visit note.  

## 2015-06-24 NOTE — Patient Instructions (Signed)

## 2015-06-25 ENCOUNTER — Other Ambulatory Visit (INDEPENDENT_AMBULATORY_CARE_PROVIDER_SITE_OTHER): Payer: Federal, State, Local not specified - PPO

## 2015-06-25 DIAGNOSIS — E041 Nontoxic single thyroid nodule: Secondary | ICD-10-CM | POA: Diagnosis not present

## 2015-06-25 DIAGNOSIS — E7800 Pure hypercholesterolemia, unspecified: Secondary | ICD-10-CM | POA: Diagnosis not present

## 2015-06-25 LAB — HEPATIC FUNCTION PANEL
ALK PHOS: 58 U/L (ref 39–117)
ALT: 32 U/L (ref 0–35)
AST: 29 U/L (ref 0–37)
Albumin: 4.1 g/dL (ref 3.5–5.2)
BILIRUBIN DIRECT: 0.1 mg/dL (ref 0.0–0.3)
TOTAL PROTEIN: 6.8 g/dL (ref 6.0–8.3)
Total Bilirubin: 0.6 mg/dL (ref 0.2–1.2)

## 2015-06-25 LAB — LIPID PANEL
CHOL/HDL RATIO: 2
Cholesterol: 204 mg/dL — ABNORMAL HIGH (ref 0–200)
HDL: 104.1 mg/dL (ref >39.00)
LDL Cholesterol: 93 mg/dL (ref 0–99)
NONHDL: 100.1
Triglycerides: 36 mg/dL (ref 0.0–149.0)
VLDL: 7.2 mg/dL (ref 0.0–40.0)

## 2015-06-25 LAB — BASIC METABOLIC PANEL
BUN: 22 mg/dL (ref 6–23)
CHLORIDE: 105 meq/L (ref 96–112)
CO2: 28 mEq/L (ref 19–32)
CREATININE: 0.76 mg/dL (ref 0.40–1.20)
Calcium: 9.1 mg/dL (ref 8.4–10.5)
GFR: 79.71 mL/min (ref >60.00)
Glucose, Bld: 89 mg/dL (ref 70–99)
POTASSIUM: 4.2 meq/L (ref 3.5–5.1)
Sodium: 141 mEq/L (ref 135–145)

## 2015-06-25 LAB — TSH: TSH: 1.65 u[IU]/mL (ref 0.35–4.50)

## 2015-07-04 ENCOUNTER — Encounter: Payer: Self-pay | Admitting: Internal Medicine

## 2015-07-04 DIAGNOSIS — Z1211 Encounter for screening for malignant neoplasm of colon: Secondary | ICD-10-CM | POA: Insufficient documentation

## 2015-07-04 NOTE — Assessment & Plan Note (Signed)
Previous ultrasound revealed tiny hypoechoic nodules in each lobe with no associated microcalcifications.  Discussed again with her today regarding f/u ultrasound.  She declines.  Follow thyroid function.

## 2015-07-04 NOTE — Assessment & Plan Note (Signed)
On simvastatin.  Low cholesterol diet and exercise.  Follow lipid panel and liver function tests.   

## 2015-07-04 NOTE — Assessment & Plan Note (Signed)
Continue vitamin D supplements.  Follow vitamin D levels.

## 2015-07-04 NOTE — Assessment & Plan Note (Signed)
Colonoscopy 10/24/06.  Recommended f/u colonoscopy in 2018.

## 2015-07-04 NOTE — Assessment & Plan Note (Signed)
Continue calcium, vitamin D and weight bearing exercise.  Follow.  

## 2015-07-06 ENCOUNTER — Ambulatory Visit
Admission: RE | Admit: 2015-07-06 | Discharge: 2015-07-06 | Disposition: A | Discharge status: Home / Self Care | Payer: Federal, State, Local not specified - PPO | Source: Ambulatory Visit | Attending: Internal Medicine | Admitting: Internal Medicine

## 2015-07-06 ENCOUNTER — Other Ambulatory Visit: Payer: Self-pay | Admitting: Internal Medicine

## 2015-07-06 DIAGNOSIS — Z1231 Encounter for screening mammogram for malignant neoplasm of breast: Secondary | ICD-10-CM | POA: Insufficient documentation

## 2015-07-06 DIAGNOSIS — Z1239 Encounter for other screening for malignant neoplasm of breast: Secondary | ICD-10-CM

## 2015-08-06 ENCOUNTER — Ambulatory Visit (INDEPENDENT_AMBULATORY_CARE_PROVIDER_SITE_OTHER): Payer: Federal, State, Local not specified - PPO | Admitting: Nurse Practitioner

## 2015-08-06 ENCOUNTER — Telehealth: Payer: Self-pay | Admitting: *Deleted

## 2015-08-06 ENCOUNTER — Encounter: Payer: Self-pay | Admitting: Nurse Practitioner

## 2015-08-06 VITALS — BP 128/74 | HR 74 | Temp 98.2°F | Wt 135.0 lb

## 2015-08-06 DIAGNOSIS — J0110 Acute frontal sinusitis, unspecified: Secondary | ICD-10-CM

## 2015-08-06 MED ORDER — AMOXICILLIN-POT CLAVULANATE 875-125 MG PO TABS: 1.0000 | ORAL_TABLET | Freq: Two times a day (BID) | ORAL | Status: DC

## 2015-08-06 NOTE — Progress Notes (Signed)
Patient ID: Jonnie KindSue H Baldonado, female    DOB: 05-15-1946  Age: 69 y.o. MRN: [id number]  CC: Cough   HPI Jonnie KindSue H Vallecillo presents for CC of cold symptoms x 10 days.   1) 10 days of symptoms. Pt reports nasal congestion, PNDrip, rhinorrhea, ear pressure bilaterally the same. Cough is non-productive  Treatment to date: Mucinex Nyquil  Some relief Sick contacts- Positive for probable sick contacts in public areas  History Fannie KneeSue has a past medical history of Ovarian cyst; Allergy; Hypercholesterolemia; Thyroid nodule; and Vitamin D deficiency.   She has past surgical history that includes Ovarian cyst removal and Breast biopsy.   Her family history includes Brain cancer in her sister; Heart disease in her mother; Hypertension in her mother; Stroke in her father.She reports that she has quit smoking. Her smoking use included Cigarettes. She has never used smokeless tobacco. She reports that she drinks alcohol. She reports that she does not use illicit drugs.  Outpatient Prescriptions Prior to Visit  Medication Sig Dispense Refill  . Calcium Carbonate-Vitamin D (CALTRATE 600+D PO) Take 1 tablet by mouth 2 (two) times daily.    . cholecalciferol (VITAMIN D) 1000 UNITS tablet Take 1,000 Units by mouth daily.    Marland Kitchen. desonide (DESOWEN) 0.05 % ointment at bedtime.  0  . fexofenadine (ALLEGRA) 180 MG tablet Take 180 mg by mouth daily.    . fluticasone (FLONASE) 50 MCG/ACT nasal spray Place 2 sprays into both nostrils daily. 16 g 5  . latanoprost (XALATAN) 0.005 % ophthalmic solution INSTILL ONE DROP IN BOTH EYES AT BEDTIME.  5  . meloxicam (MOBIC) 7.5 MG tablet TAKE 1 TABLET (7.5 MG TOTAL) BY MOUTH DAILY AS NEEDED. 90 tablet 1  . pantoprazole (PROTONIX) 40 MG tablet Take 1 tablet (40 mg total) by mouth daily. 30 tablet 5  . pyridOXINE (VITAMIN B-6) 100 MG tablet Take 100 mg by mouth daily.    . simvastatin (ZOCOR) 10 MG tablet TAKE 1 TABLET BY MOUTH AT BEDTIME. 30 tablet 5   No facility-administered  medications prior to visit.    ROS Review of Systems  Constitutional: Negative for fever, chills, diaphoresis and fatigue.  HENT: Positive for congestion, ear pain, postnasal drip, rhinorrhea and sinus pressure. Negative for ear discharge, sneezing and sore throat.   Eyes: Negative for discharge and itching.  Respiratory: Positive for cough. Negative for chest tightness, shortness of breath and wheezing.   Cardiovascular: Negative for chest pain, palpitations and leg swelling.  Gastrointestinal: Negative for nausea, vomiting and diarrhea.  Musculoskeletal: Negative for myalgias and neck pain.  Skin: Negative for rash.  Neurological: Negative for dizziness and headaches.   Objective:  BP 128/74 mmHg  Pulse 74  Temp(Src) 98.2 F (36.8 C) (Oral)  Wt 135 lb (61.236 kg)  SpO2 97%  Physical Exam  Constitutional: She is oriented to person, place, and time. She appears well-developed and well-nourished. No distress.  HENT:  Head: Normocephalic and atraumatic.  Right Ear: External ear normal.  Left Ear: External ear normal.  Mouth/Throat: No oropharyngeal exudate.  TM's clear bilaterally  Eyes: EOM are normal. Pupils are equal, round, and reactive to light. Right eye exhibits no discharge. Left eye exhibits no discharge. No scleral icterus.  Neck: Normal range of motion.  Cardiovascular: Normal rate, regular rhythm and normal heart sounds.  Exam reveals no gallop and no friction rub.   No murmur heard. Pulmonary/Chest: Effort normal and breath sounds normal. No respiratory distress. She has no wheezes. She has no  rales. She exhibits no tenderness.  Lymphadenopathy:    She has no cervical adenopathy.  Neurological: She is alert and oriented to person, place, and time. No cranial nerve deficit. She exhibits normal muscle tone. Coordination normal.  Skin: Skin is warm and dry. No rash noted. She is not diaphoretic.  Psychiatric: She has a normal mood and affect. Her behavior is normal.  Judgment and thought content normal.   Assessment & Plan:   Neya was seen today for cough.  Diagnoses and all orders for this visit:  Acute frontal sinusitis, recurrence not specified  Other orders -     amoxicillin-clavulanate (AUGMENTIN) 875-125 MG tablet; Take 1 tablet by mouth 2 (two) times daily.   I am having Ms. Stryker start on amoxicillin-clavulanate. I am also having her maintain her fexofenadine, Calcium Carbonate-Vitamin D (CALTRATE 600+D PO), cholecalciferol, pyridOXINE, pantoprazole, fluticasone, desonide, meloxicam, latanoprost, and simvastatin.  Meds ordered this encounter  Medications  . amoxicillin-clavulanate (AUGMENTIN) 875-125 MG tablet    Sig: Take 1 tablet by mouth 2 (two) times daily.    Dispense:  14 tablet    Refill:  0    Order Specific Question:  Supervising Provider    Answer:  Sherlene Shams [[id number]]     Follow-up: Return if symptoms worsen or fail to improve.

## 2015-08-06 NOTE — Patient Instructions (Addendum)
Please take a probiotic ( Align, Floraque or Culturelle) while you are on the antibiotic to prevent a serious antibiotic associated diarrhea  Called clostirudium dificile colitis and a vaginal yeast infection.   Take the Augmentin as prescribed.   Continue NyQuil at night, Mucinex DM during the daytime, and Flonase

## 2015-08-16 DIAGNOSIS — J0110 Acute frontal sinusitis, unspecified: Secondary | ICD-10-CM | POA: Insufficient documentation

## 2015-08-16 NOTE — Assessment & Plan Note (Signed)
New onset x 10 days of symptoms. Head > chest symptoms. Will treat with Augmentin, flonase, allegra, and Mucinex OTC. Encouraged probiotics. FU prn worsening/failure to improve.

## 2015-09-09 ENCOUNTER — Other Ambulatory Visit: Payer: Self-pay | Admitting: Internal Medicine

## 2015-09-09 NOTE — Telephone Encounter (Signed)
Pt last OV was 06/24/15, Pt last filled 03/04/15 #90 tabs with 1 refill. Please advise/tvw

## 2015-09-09 NOTE — Telephone Encounter (Signed)
ok'd refill for meloxicam #90 with one refill.

## 2015-12-21 ENCOUNTER — Other Ambulatory Visit (INDEPENDENT_AMBULATORY_CARE_PROVIDER_SITE_OTHER): Payer: Federal, State, Local not specified - PPO

## 2015-12-21 ENCOUNTER — Telehealth: Payer: Self-pay | Admitting: *Deleted

## 2015-12-21 ENCOUNTER — Other Ambulatory Visit: Payer: Self-pay | Admitting: Internal Medicine

## 2015-12-21 DIAGNOSIS — E041 Nontoxic single thyroid nodule: Secondary | ICD-10-CM

## 2015-12-21 DIAGNOSIS — E559 Vitamin D deficiency, unspecified: Secondary | ICD-10-CM

## 2015-12-21 DIAGNOSIS — M8580 Other specified disorders of bone density and structure, unspecified site: Secondary | ICD-10-CM | POA: Diagnosis not present

## 2015-12-21 DIAGNOSIS — E7800 Pure hypercholesterolemia, unspecified: Secondary | ICD-10-CM

## 2015-12-21 DIAGNOSIS — L989 Disorder of the skin and subcutaneous tissue, unspecified: Secondary | ICD-10-CM

## 2015-12-21 LAB — CBC WITH DIFFERENTIAL/PLATELET
BASOS PCT: 0.8 % (ref 0.0–3.0)
Basophils Absolute: 0 10*3/uL (ref 0.0–0.1)
EOS PCT: 4.1 % (ref 0.0–5.0)
Eosinophils Absolute: 0.2 10*3/uL (ref 0.0–0.7)
HCT: 46.5 % — ABNORMAL HIGH (ref 36.0–46.0)
Hemoglobin: 15.5 g/dL — ABNORMAL HIGH (ref 12.0–15.0)
LYMPHS ABS: 2.2 10*3/uL (ref 0.7–4.0)
Lymphocytes Relative: 39 % (ref 12.0–46.0)
MCHC: 33.4 g/dL (ref 30.0–36.0)
MCV: 90.4 fl (ref 78.0–100.0)
MONO ABS: 0.5 10*3/uL (ref 0.1–1.0)
MONOS PCT: 8.5 % (ref 3.0–12.0)
NEUTROS ABS: 2.6 10*3/uL (ref 1.4–7.7)
NEUTROS PCT: 47.6 % (ref 43.0–77.0)
PLATELETS: 254 10*3/uL (ref 150.0–400.0)
RBC: 5.15 Mil/uL — AB (ref 3.87–5.11)
RDW: 12.7 % (ref 11.5–15.5)
WBC: 5.6 10*3/uL (ref 4.0–10.5)

## 2015-12-21 LAB — HEPATIC FUNCTION PANEL
ALBUMIN: 4.3 g/dL (ref 3.5–5.2)
ALT: 29 U/L (ref 0–35)
AST: 27 U/L (ref 0–37)
Alkaline Phosphatase: 58 U/L (ref 39–117)
BILIRUBIN DIRECT: 0.1 mg/dL (ref 0.0–0.3)
TOTAL PROTEIN: 6.7 g/dL (ref 6.0–8.3)
Total Bilirubin: 0.6 mg/dL (ref 0.2–1.2)

## 2015-12-21 LAB — LIPID PANEL
CHOLESTEROL: 204 mg/dL — AB (ref 0–200)
HDL: 94.8 mg/dL (ref >39.00)
LDL CALC: 94 mg/dL (ref 0–99)
NonHDL: 109.17
Total CHOL/HDL Ratio: 2
Triglycerides: 76 mg/dL (ref 0.0–149.0)
VLDL: 15.2 mg/dL (ref 0.0–40.0)

## 2015-12-21 LAB — BASIC METABOLIC PANEL
BUN: 19 mg/dL (ref 6–23)
CALCIUM: 9.3 mg/dL (ref 8.4–10.5)
CO2: 29 mEq/L (ref 19–32)
Chloride: 104 mEq/L (ref 96–112)
Creatinine, Ser: 0.72 mg/dL (ref 0.40–1.20)
GFR: 84.72 mL/min (ref >60.00)
GLUCOSE: 86 mg/dL (ref 70–99)
Potassium: 4.2 mEq/L (ref 3.5–5.1)
SODIUM: 140 meq/L (ref 135–145)

## 2015-12-21 LAB — VITAMIN D 25 HYDROXY (VIT D DEFICIENCY, FRACTURES): VITD: 54.65 ng/mL (ref 30.00–100.00)

## 2015-12-21 NOTE — Telephone Encounter (Signed)
Labs and dx?  

## 2015-12-21 NOTE — Progress Notes (Signed)
Orders placed for labs

## 2015-12-21 NOTE — Telephone Encounter (Signed)
Orders placed for labs

## 2015-12-23 ENCOUNTER — Encounter: Payer: Self-pay | Admitting: Internal Medicine

## 2015-12-23 ENCOUNTER — Ambulatory Visit (INDEPENDENT_AMBULATORY_CARE_PROVIDER_SITE_OTHER): Payer: Federal, State, Local not specified - PPO | Admitting: Internal Medicine

## 2015-12-23 VITALS — BP 118/80 | HR 88 | Temp 98.7°F | Resp 14 | Ht 61.0 in | Wt 134.1 lb

## 2015-12-23 DIAGNOSIS — E7800 Pure hypercholesterolemia, unspecified: Secondary | ICD-10-CM

## 2015-12-23 DIAGNOSIS — Z1211 Encounter for screening for malignant neoplasm of colon: Secondary | ICD-10-CM | POA: Diagnosis not present

## 2015-12-23 DIAGNOSIS — Z0000 Encounter for general adult medical examination without abnormal findings: Secondary | ICD-10-CM

## 2015-12-23 DIAGNOSIS — M8580 Other specified disorders of bone density and structure, unspecified site: Secondary | ICD-10-CM

## 2015-12-23 DIAGNOSIS — E559 Vitamin D deficiency, unspecified: Secondary | ICD-10-CM | POA: Diagnosis not present

## 2015-12-23 DIAGNOSIS — E041 Nontoxic single thyroid nodule: Secondary | ICD-10-CM

## 2015-12-23 NOTE — Progress Notes (Signed)
Pre visit review using our clinic review tool, if applicable. No additional management support is needed unless otherwise documented below in the visit note. 

## 2015-12-23 NOTE — Progress Notes (Signed)
Patient ID: Vickie Crawford, female   DOB: 01-13-1946, 70 y.o.   MRN: [id number]   Subjective:    Patient ID: Vickie Crawford, female    DOB: 01-13-1946, 70 y.o.   MRN: [id number]  HPI  Patient here for a physical exam.  She states she is doing well.  Feels good.  No chest pain or tightness.  No sob.  No acid reflux.  No abdominal pain or cramping.  Bowels stable.  Some low back discomfort.  Seeing Dr Anner CreteWells - chiropractor.  Helps.  Doing better.  Flares intermittently.     Past Medical History  Diagnosis Date  . Ovarian cyst     Hospitalized 1969 and 1974 for removal  . Allergy   . Hypercholesterolemia   . Thyroid nodule   . Vitamin D deficiency    Past Surgical History  Procedure Laterality Date  . Ovarian cyst removal      1969 and 1974  . Breast biopsy     Family History  Problem Relation Age of Onset  . Hypertension Mother   . Heart disease Mother   . Stroke Father   . Brain cancer Sister    Social History   Social History  . Marital Status: Unknown    Spouse Name: N/A  . Number of Children: 2  . Years of Education: N/A   Social History Main Topics  . Smoking status: Former Smoker    Types: Cigarettes  . Smokeless tobacco: Never Used  . Alcohol Use: 0.0 oz/week    0 Standard drinks or equivalent per week  . Drug Use: No  . Sexual Activity: Not Asked   Other Topics Concern  . None   Social History Narrative    Outpatient Encounter Prescriptions as of 12/23/2015  Medication Sig  . Calcium Carbonate-Vitamin D (CALTRATE 600+D PO) Take 1 tablet by mouth 2 (two) times daily.  . cholecalciferol (VITAMIN D) 1000 UNITS tablet Take 1,000 Units by mouth daily.  Marland Kitchen. desonide (DESOWEN) 0.05 % ointment at bedtime.  . fexofenadine (ALLEGRA) 180 MG tablet Take 180 mg by mouth daily.  . fluticasone (FLONASE) 50 MCG/ACT nasal spray Place 2 sprays into both nostrils daily.  Marland Kitchen. latanoprost (XALATAN) 0.005 % ophthalmic solution INSTILL ONE DROP IN BOTH EYES AT BEDTIME.  . meloxicam  (MOBIC) 7.5 MG tablet TAKE 1 TABLET (7.5 MG TOTAL) BY MOUTH DAILY AS NEEDED.  Marland Kitchen. pyridOXINE (VITAMIN B-6) 100 MG tablet Take 100 mg by mouth daily.  . simvastatin (ZOCOR) 10 MG tablet TAKE 1 TABLET BY MOUTH AT BEDTIME.  . [DISCONTINUED] amoxicillin-clavulanate (AUGMENTIN) 875-125 MG tablet Take 1 tablet by mouth 2 (two) times daily.  . [DISCONTINUED] pantoprazole (PROTONIX) 40 MG tablet Take 1 tablet (40 mg total) by mouth daily.   No facility-administered encounter medications on file as of 12/23/2015.    Review of Systems  Constitutional: Negative for appetite change and unexpected weight change.  HENT: Negative for congestion and sinus pressure.   Eyes: Negative for pain and visual disturbance.  Respiratory: Negative for cough, chest tightness and shortness of breath.   Cardiovascular: Negative for chest pain, palpitations and leg swelling.  Gastrointestinal: Negative for nausea, vomiting, abdominal pain and diarrhea.  Genitourinary: Negative for dysuria and difficulty urinating.  Musculoskeletal: Positive for back pain. Negative for joint swelling.  Skin: Negative for color change and rash.  Neurological: Negative for dizziness, light-headedness and headaches.  Hematological: Negative for adenopathy. Does not bruise/bleed easily.  Psychiatric/Behavioral: Negative for dysphoric mood and agitation.  Objective:     Blood pressure rechecked by me:  128/80  Physical Exam  Constitutional: She is oriented to person, place, and time. She appears well-developed and well-nourished. No distress.  HENT:  Nose: Nose normal.  Mouth/Throat: Oropharynx is clear and moist.  Eyes: Right eye exhibits no discharge. Left eye exhibits no discharge. No scleral icterus.  Neck: Neck supple. No thyromegaly present.  Cardiovascular: Normal rate and regular rhythm.   Pulmonary/Chest: Breath sounds normal. No accessory muscle usage. No tachypnea. No respiratory distress. She has no decreased breath  sounds. She has no wheezes. She has no rhonchi. Right breast exhibits no inverted nipple, no mass, no nipple discharge and no tenderness (no axillary adenopathy). Left breast exhibits no inverted nipple, no mass, no nipple discharge and no tenderness (no axilarry adenopathy).  Abdominal: Soft. Bowel sounds are normal. There is no tenderness.  Musculoskeletal: She exhibits no edema or tenderness.  Lymphadenopathy:    She has no cervical adenopathy.  Neurological: She is alert and oriented to person, place, and time.  Skin: Skin is warm. No rash noted. No erythema.  Psychiatric: She has a normal mood and affect. Her behavior is normal.    BP 118/80 mmHg  Pulse 88  Temp(Src) 98.7 F (37.1 C) (Oral)  Resp 14  Ht  (1.549 m)  Wt 134 lb 1.9 oz (60.836 kg)  BMI 25.35 kg/m2  SpO2 95% Wt Readings from Last 3 Encounters:  12/23/15 134 lb 1.9 oz (60.836 kg)  08/06/15 135 lb (61.236 kg)  06/24/15 133 lb (60.328 kg)     Lab Results  Component Value Date   WBC 5.6 12/21/2015   HGB 15.5* 12/21/2015   HCT 46.5* 12/21/2015   PLT 254.0 12/21/2015   GLUCOSE 86 12/21/2015   CHOL 204* 12/21/2015   TRIG 76.0 12/21/2015   HDL 94.80 12/21/2015   LDLCALC 94 12/21/2015   ALT 29 12/21/2015   AST 27 12/21/2015   NA 140 12/21/2015   K 4.2 12/21/2015   CL 104 12/21/2015   CREATININE 0.72 12/21/2015   BUN 19 12/21/2015   CO2 29 12/21/2015   TSH 1.65 06/25/2015    Mm Digital Screening Bilateral  07/07/2015  CLINICAL DATA:  Screening. EXAM: DIGITAL SCREENING BILATERAL MAMMOGRAM WITH CAD COMPARISON:  Previous exam(s). ACR Breast Density Category b: There are scattered areas of fibroglandular density. FINDINGS: There are no findings suspicious for malignancy. Images were processed with CAD. IMPRESSION: No mammographic evidence of malignancy. A result letter of this screening mammogram will be mailed directly to the patient. RECOMMENDATION: Screening mammogram in one year. (Code:SM-B-01Y) BI-RADS  CATEGORY  1: Negative. Electronically Signed   By: Sherian Rein M.D.   On: 07/07/2015 09:26       Assessment & Plan:   Problem List Items Addressed This Visit    Colon cancer screening - Primary    Colonoscopy 10/24/06.  Recommend f/u colonoscopy in 2018.        Relevant Orders   Fecal occult blood, imunochemical   Health care maintenance    Physical today 12/23/15.  Colonoscopy 10/24/06.  IFOB given.  F/u colonoscopy in 2018.  Mammogram 07/07/15 - birads I.        Nontoxic uninodular goiter    Has declined f/u ultrasound.        Osteopenia    Continue calcium, vitamin D and weight bearing exercise.        Pure hypercholesterolemia    On simvastatin.  Low cholesterol diet and exercise.  Follow lipid panel and liver function tests.        Relevant Orders   TSH   Lipid panel   Hepatic function panel   Basic metabolic panel   Vitamin D deficiency    Continue vitamin D supplements.  Follow vitamin D level.           Dale [place], MD

## 2015-12-26 ENCOUNTER — Encounter: Payer: Self-pay | Admitting: Internal Medicine

## 2015-12-26 NOTE — Assessment & Plan Note (Signed)
Continue calcium, vitamin D and weight bearing exercise.  

## 2015-12-26 NOTE — Assessment & Plan Note (Signed)
Continue vitamin D supplements.  Follow vitamin D level.   

## 2015-12-26 NOTE — Assessment & Plan Note (Signed)
Has declined f/u ultrasound.   

## 2015-12-26 NOTE — Assessment & Plan Note (Signed)
Physical today 12/23/15.  Colonoscopy 10/24/06.  IFOB given.  F/u colonoscopy in 2018.  Mammogram 07/07/15 - birads I.

## 2015-12-26 NOTE — Assessment & Plan Note (Signed)
On simvastatin.  Low cholesterol diet and exercise.  Follow lipid panel and liver function tests.   

## 2015-12-26 NOTE — Assessment & Plan Note (Signed)
Colonoscopy 10/24/06.  Recommend f/u colonoscopy in 2018.

## 2015-12-29 ENCOUNTER — Other Ambulatory Visit: Payer: Self-pay | Admitting: Internal Medicine

## 2015-12-31 ENCOUNTER — Other Ambulatory Visit (INDEPENDENT_AMBULATORY_CARE_PROVIDER_SITE_OTHER): Payer: Federal, State, Local not specified - PPO

## 2015-12-31 DIAGNOSIS — Z1211 Encounter for screening for malignant neoplasm of colon: Secondary | ICD-10-CM | POA: Diagnosis not present

## 2015-12-31 LAB — FECAL OCCULT BLOOD, IMMUNOCHEMICAL: FECAL OCCULT BLD: NEGATIVE

## 2016-01-03 ENCOUNTER — Encounter: Payer: Self-pay | Admitting: *Deleted

## 2016-03-08 ENCOUNTER — Other Ambulatory Visit: Payer: Self-pay | Admitting: Internal Medicine

## 2016-03-28 ENCOUNTER — Other Ambulatory Visit: Payer: Self-pay

## 2016-03-28 MED ORDER — SIMVASTATIN 10 MG PO TABS: 10.0000 mg | ORAL_TABLET | Freq: Every day | ORAL | 5 refills | Status: DC

## 2016-03-29 ENCOUNTER — Other Ambulatory Visit: Payer: Self-pay | Admitting: *Deleted

## 2016-03-29 MED ORDER — SIMVASTATIN 10 MG PO TABS: 10.0000 mg | ORAL_TABLET | Freq: Every day | ORAL | 1 refills | Status: DC

## 2016-03-31 ENCOUNTER — Other Ambulatory Visit: Payer: Self-pay

## 2016-03-31 MED ORDER — SIMVASTATIN 10 MG PO TABS: 10.0000 mg | ORAL_TABLET | Freq: Every day | ORAL | 1 refills | Status: DC

## 2016-05-01 ENCOUNTER — Other Ambulatory Visit: Payer: Self-pay | Admitting: Internal Medicine

## 2016-06-05 ENCOUNTER — Other Ambulatory Visit: Payer: Self-pay | Admitting: Internal Medicine

## 2016-06-05 NOTE — Telephone Encounter (Signed)
Last seen 12/23/15 last filled 03/08/16

## 2016-06-21 ENCOUNTER — Other Ambulatory Visit (INDEPENDENT_AMBULATORY_CARE_PROVIDER_SITE_OTHER): Payer: Federal, State, Local not specified - PPO

## 2016-06-21 DIAGNOSIS — E7800 Pure hypercholesterolemia, unspecified: Secondary | ICD-10-CM

## 2016-06-21 LAB — LIPID PANEL
CHOLESTEROL: 209 mg/dL — AB (ref 0–200)
HDL: 93 mg/dL (ref >39.00)
LDL Cholesterol: 103 mg/dL — ABNORMAL HIGH (ref 0–99)
NONHDL: 116.35
Total CHOL/HDL Ratio: 2
Triglycerides: 67 mg/dL (ref 0.0–149.0)
VLDL: 13.4 mg/dL (ref 0.0–40.0)

## 2016-06-21 LAB — TSH: TSH: 2 u[IU]/mL (ref 0.35–4.50)

## 2016-06-21 LAB — BASIC METABOLIC PANEL
BUN: 23 mg/dL (ref 6–23)
CO2: 30 meq/L (ref 19–32)
Calcium: 9.7 mg/dL (ref 8.4–10.5)
Chloride: 105 mEq/L (ref 96–112)
Creatinine, Ser: 0.74 mg/dL (ref 0.40–1.20)
GFR: 81.97 mL/min (ref >60.00)
GLUCOSE: 90 mg/dL (ref 70–99)
POTASSIUM: 4.6 meq/L (ref 3.5–5.1)
SODIUM: 142 meq/L (ref 135–145)

## 2016-06-21 LAB — HEPATIC FUNCTION PANEL
ALT: 31 U/L (ref 0–35)
AST: 22 U/L (ref 0–37)
Albumin: 4.4 g/dL (ref 3.5–5.2)
Alkaline Phosphatase: 59 U/L (ref 39–117)
BILIRUBIN DIRECT: 0.1 mg/dL (ref 0.0–0.3)
BILIRUBIN TOTAL: 0.6 mg/dL (ref 0.2–1.2)
Total Protein: 6.7 g/dL (ref 6.0–8.3)

## 2016-06-23 ENCOUNTER — Ambulatory Visit (INDEPENDENT_AMBULATORY_CARE_PROVIDER_SITE_OTHER): Payer: Federal, State, Local not specified - PPO | Admitting: Internal Medicine

## 2016-06-23 ENCOUNTER — Encounter: Payer: Self-pay | Admitting: Internal Medicine

## 2016-06-23 VITALS — BP 126/78 | HR 89 | Temp 98.5°F | Ht 61.0 in | Wt 134.8 lb

## 2016-06-23 DIAGNOSIS — Z23 Encounter for immunization: Secondary | ICD-10-CM | POA: Diagnosis not present

## 2016-06-23 DIAGNOSIS — M25571 Pain in right ankle and joints of right foot: Secondary | ICD-10-CM

## 2016-06-23 DIAGNOSIS — E7800 Pure hypercholesterolemia, unspecified: Secondary | ICD-10-CM | POA: Diagnosis not present

## 2016-06-23 NOTE — Progress Notes (Signed)
Pre visit review using our clinic review tool, if applicable. No additional management support is needed unless otherwise documented below in the visit note. 

## 2016-06-23 NOTE — Progress Notes (Signed)
Patient ID: Vickie Crawford, female   DOB: [birth year], 70 y.o.   MRN: [id number]   Subjective:    Patient ID: Vickie Crawford, female    DOB: 07/04/1946, 70 y.o.   MRN: [id number]  HPI  Patient here for a scheduled follow up.  Fell 6 weeks ago.  S/p right foot and ankle injury - ankle sprain.   Saw ortho.  Is better.  Still some pain, but overall improved.  Normally stays active.  No chest pain.  No sob.  No acid reflux.  No abdominal pain or cramping.  Bowels stable.  Discussed recent labs.  LDL 103.     Past Medical History:  Diagnosis Date  . Allergy   . Hypercholesterolemia   . Ovarian cyst    Hospitalized 1969 and 1974 for removal  . Thyroid nodule   . Vitamin D deficiency    Past Surgical History:  Procedure Laterality Date  . BREAST BIOPSY    . OVARIAN CYST REMOVAL     1969 and 1974   Family History  Problem Relation Age of Onset  . Hypertension Mother   . Heart disease Mother   . Stroke Father   . Brain cancer Sister    Social History   Social History  . Marital status: Unknown    Spouse name: N/A  . Number of children: 2  . Years of education: N/A   Social History Main Topics  . Smoking status: Former Smoker    Types: Cigarettes  . Smokeless tobacco: Never Used  . Alcohol use 0.0 oz/week  . Drug use: No  . Sexual activity: Not Asked   Other Topics Concern  . None   Social History Narrative  . None    Outpatient Encounter Prescriptions as of 06/23/2016  Medication Sig  . Calcium Carbonate-Vitamin D (CALTRATE 600+D PO) Take 1 tablet by mouth 2 (two) times daily.  . cholecalciferol (VITAMIN D) 1000 UNITS tablet Take 1,000 Units by mouth daily.  Marland Kitchen desonide (DESOWEN) 0.05 % ointment at bedtime.  . fexofenadine (ALLEGRA) 180 MG tablet Take 180 mg by mouth daily.  . fluticasone (FLONASE) 50 MCG/ACT nasal spray PLACE 2 SPRAYS INTO BOTH NOSTRILS DAILY.  Marland Kitchen latanoprost (XALATAN) 0.005 % ophthalmic solution INSTILL ONE DROP IN BOTH EYES AT BEDTIME.  . meloxicam  (MOBIC) 7.5 MG tablet TAKE 1 TABLET (7.5 MG TOTAL) BY MOUTH DAILY AS NEEDED.  Marland Kitchen pyridOXINE (VITAMIN B-6) 100 MG tablet Take 100 mg by mouth daily.  . simvastatin (ZOCOR) 10 MG tablet Take 1 tablet (10 mg total) by mouth at bedtime.  . [DISCONTINUED] meloxicam (MOBIC) 7.5 MG tablet TAKE 1 TABLET (7.5 MG TOTAL) BY MOUTH DAILY AS NEEDED.  . [DISCONTINUED] simvastatin (ZOCOR) 10 MG tablet Take 1 tablet (10 mg total) by mouth at bedtime.   No facility-administered encounter medications on file as of 06/23/2016.     Review of Systems  Constitutional: Negative for fatigue and unexpected weight change.  HENT: Negative for congestion and sinus pressure.   Respiratory: Negative for cough, chest tightness and shortness of breath.   Cardiovascular: Negative for chest pain, palpitations and leg swelling.  Gastrointestinal: Negative for abdominal pain, diarrhea, nausea and vomiting.  Genitourinary: Negative for difficulty urinating and dysuria.  Musculoskeletal: Negative for myalgias.       Some persistent right ankle/foot pain.  Improved.    Skin: Negative for color change and rash.  Neurological: Negative for dizziness, light-headedness and headaches.  Psychiatric/Behavioral: Negative for agitation and dysphoric mood.  Objective:    Physical Exam  Constitutional: She appears well-developed and well-nourished. No distress.  HENT:  Nose: Nose normal.  Mouth/Throat: Oropharynx is clear and moist.  Neck: Neck supple. No thyromegaly present.  Cardiovascular: Normal rate and regular rhythm.   Pulmonary/Chest: Breath sounds normal. No respiratory distress. She has no wheezes.  Abdominal: Soft. Bowel sounds are normal. There is no tenderness.  Musculoskeletal: She exhibits no edema or tenderness.  Lymphadenopathy:    She has no cervical adenopathy.  Skin: No rash noted. No erythema.  Psychiatric: She has a normal mood and affect. Her behavior is normal.    BP 126/78   Pulse 89   Temp  98.5 F (36.9 C) (Oral)   Ht 5\' 1"  (1.549 m)   Wt 134 lb 12.8 oz (61.1 kg)   SpO2 96%   BMI 25.47 kg/m  Wt Readings from Last 3 Encounters:  06/23/16 134 lb 12.8 oz (61.1 kg)  12/23/15 134 lb 1.9 oz (60.8 kg)  08/06/15 135 lb (61.2 kg)     Lab Results  Component Value Date   WBC 5.6 12/21/2015   HGB 15.5 (H) 12/21/2015   HCT 46.5 (H) 12/21/2015   PLT 254.0 12/21/2015   GLUCOSE 90 06/21/2016   CHOL 209 (H) 06/21/2016   TRIG 67.0 06/21/2016   HDL 93.00 06/21/2016   LDLCALC 103 (H) 06/21/2016   ALT 31 06/21/2016   AST 22 06/21/2016   NA 142 06/21/2016   K 4.6 06/21/2016   CL 105 06/21/2016   CREATININE 0.74 06/21/2016   BUN 23 06/21/2016   CO2 30 06/21/2016   TSH 2.00 06/21/2016    Mm Digital Screening Bilateral  Result Date: 07/07/2015 CLINICAL DATA:  Screening. EXAM: DIGITAL SCREENING BILATERAL MAMMOGRAM WITH CAD COMPARISON:  Previous exam(s). ACR Breast Density Category b: There are scattered areas of fibroglandular density. FINDINGS: There are no findings suspicious for malignancy. Images were processed with CAD. IMPRESSION: No mammographic evidence of malignancy. A result letter of this screening mammogram will be mailed directly to the patient. RECOMMENDATION: Screening mammogram in one year. (Code:SM-B-01Y) BI-RADS CATEGORY  1: Negative. Electronically Signed   By: Sherian ReinWei-Chen  Lin M.D.   On: 07/07/2015 09:26       Assessment & Plan:   Problem List Items Addressed This Visit    Pure hypercholesterolemia    Low cholesterol diet and exercise.  On simvastatin.  Follow lipid panel and liver function tests.        Relevant Medications   simvastatin (ZOCOR) 10 MG tablet   Other Relevant Orders   Hepatic function panel   Lipid panel   Basic metabolic panel    Other Visit Diagnoses    Acute right ankle pain    -  Primary   s/p fall and right ankle strain.  doing better.  follow.     Relevant Orders   Lipid panel   Encounter for immunization       Relevant Orders    Flu vaccine HIGH DOSE PF (Completed)       Dale DurhamSCOTT, Darielys Giglia, MD

## 2016-06-25 ENCOUNTER — Encounter: Payer: Self-pay | Admitting: Internal Medicine

## 2016-06-25 MED ORDER — SIMVASTATIN 10 MG PO TABS: 10.0000 mg | ORAL_TABLET | Freq: Every day | ORAL | 3 refills | Status: DC

## 2016-06-25 MED ORDER — MELOXICAM 7.5 MG PO TABS: ORAL_TABLET | ORAL | 0 refills | Status: DC

## 2016-06-25 NOTE — Assessment & Plan Note (Signed)
Low cholesterol diet and exercise.  On simvastatin.  Follow lipid panel and liver function tests.   

## 2016-08-16 ENCOUNTER — Other Ambulatory Visit: Payer: Self-pay | Admitting: Internal Medicine

## 2016-08-16 DIAGNOSIS — Z1231 Encounter for screening mammogram for malignant neoplasm of breast: Secondary | ICD-10-CM

## 2016-10-04 ENCOUNTER — Ambulatory Visit
Admission: RE | Admit: 2016-10-04 | Discharge: 2016-10-04 | Disposition: A | Discharge status: Home / Self Care | Payer: Federal, State, Local not specified - PPO | Source: Ambulatory Visit | Attending: Internal Medicine | Admitting: Internal Medicine

## 2016-10-04 DIAGNOSIS — Z1231 Encounter for screening mammogram for malignant neoplasm of breast: Secondary | ICD-10-CM | POA: Diagnosis not present

## 2016-12-05 ENCOUNTER — Other Ambulatory Visit: Payer: Self-pay | Admitting: Internal Medicine

## 2016-12-05 NOTE — Telephone Encounter (Signed)
Medication: Mobic 7.5 Directions: 1 po qd prn   Last given: 06/23/16 # 90 Number refills: 0 Last o/v: 06/23/16 Follow up: n/a

## 2016-12-19 ENCOUNTER — Ambulatory Visit (INDEPENDENT_AMBULATORY_CARE_PROVIDER_SITE_OTHER): Payer: Federal, State, Local not specified - PPO | Admitting: Family

## 2016-12-19 ENCOUNTER — Encounter: Payer: Self-pay | Admitting: Family

## 2016-12-19 VITALS — BP 134/94 | HR 76 | Temp 98.1°F | Ht 61.0 in | Wt 136.6 lb

## 2016-12-19 DIAGNOSIS — B029 Zoster without complications: Secondary | ICD-10-CM

## 2016-12-19 MED ORDER — VALACYCLOVIR HCL 1 G PO TABS: 1000.0000 mg | ORAL_TABLET | Freq: Three times a day (TID) | ORAL | 0 refills | Status: DC

## 2016-12-19 NOTE — Progress Notes (Signed)
Subjective:    Patient ID: ONYA EUTSLER, female    DOB: 18-Apr-1946, 71 y.o.   MRN: [id number]  CC: MAKENIZE MESSMAN is a 71 y.o. female who presents today for an acute visit.    HPI: CC: rash around waist x one day.  Painful and itchy.  Otherwise feels well.Prior to onset of rash present, felt like it was sore and felt 'sore' if pushed it.  No yard work, no new lotions.       HISTORY:  Past Medical History:  Diagnosis Date  . Allergy   . Hypercholesterolemia   . Ovarian cyst    Hospitalized 1969 and 1974 for removal  . Thyroid nodule   . Vitamin D deficiency    Past Surgical History:  Procedure Laterality Date  . OVARIAN CYST REMOVAL     1969 and 1974   Family History  Problem Relation Age of Onset  . Hypertension Mother   . Heart disease Mother   . Stroke Father   . Brain cancer Sister     Allergies: Patient has no known allergies. Current Outpatient Prescriptions on File Prior to Visit  Medication Sig Dispense Refill  . Calcium Carbonate-Vitamin D (CALTRATE 600+D PO) Take 1 tablet by mouth 2 (two) times daily.    . cholecalciferol (VITAMIN D) 1000 UNITS tablet Take 1,000 Units by mouth daily.    Marland Kitchen desonide (DESOWEN) 0.05 % ointment at bedtime.  0  . fexofenadine (ALLEGRA) 180 MG tablet Take 180 mg by mouth daily.    . fluticasone (FLONASE) 50 MCG/ACT nasal spray PLACE 2 SPRAYS INTO BOTH NOSTRILS DAILY. 16 g 5  . latanoprost (XALATAN) 0.005 % ophthalmic solution INSTILL ONE DROP IN BOTH EYES AT BEDTIME.  5  . meloxicam (MOBIC) 7.5 MG tablet TAKE 1 TABLET (7.5 MG TOTAL) BY MOUTH DAILY AS NEEDED. 90 tablet 0  . pyridOXINE (VITAMIN B-6) 100 MG tablet Take 100 mg by mouth daily.    . simvastatin (ZOCOR) 10 MG tablet Take 1 tablet (10 mg total) by mouth at bedtime. 30 tablet 3   No current facility-administered medications on file prior to visit.     Social History  Substance Use Topics  . Smoking status: Former Smoker    Types: Cigarettes  . Smokeless tobacco: Never  Used  . Alcohol use 0.0 oz/week    Review of Systems  Constitutional: Negative for chills and fever.  Respiratory: Negative for cough.   Cardiovascular: Negative for chest pain and palpitations.  Gastrointestinal: Negative for nausea and vomiting.  Skin: Positive for rash.      Objective:    BP (!) 134/94   Pulse 76   Temp 98.1 F (36.7 C) (Oral)   Ht  (1.549 m)   Wt 136 lb 9.6 oz (62 kg)   SpO2 96%   BMI 25.81 kg/m    Physical Exam  Constitutional: She appears well-developed and well-nourished.  Eyes: Conjunctivae are normal.  Cardiovascular: Normal rate, regular rhythm, normal heart sounds and normal pulses.   Pulmonary/Chest: Effort normal and breath sounds normal. She has no wheezes. She has no rhonchi. She has no rales.  Neurological: She is alert.  Skin: Skin is warm and dry. Rash noted. Rash is maculopapular. Rash is not vesicular.     Dermatomal erythematous maculopapular rash  Psychiatric: She has a normal mood and affect. Her speech is normal and behavior is normal. Thought content normal.  Vitals reviewed.      Assessment & Plan:  1. Herpes zoster without complication Symptoms, presentation most consistent with shingles. Start antiviral. Advised shingrex after rash resolves.  - valACYclovir (VALTREX) 1000 MG tablet; Take 1 tablet (1,000 mg total) by mouth 3 (three) times daily.  Dispense: 21 tablet; Refill: 0    I am having Ms. Shark maintain her fexofenadine, Calcium Carbonate-Vitamin D (CALTRATE 600+D PO), cholecalciferol, pyridOXINE, desonide, latanoprost, fluticasone, simvastatin, and meloxicam.   No orders of the defined types were placed in this encounter.   Return precautions given.   Risks, benefits, and alternatives of the medications and treatment plan prescribed today were discussed, and patient expressed understanding.   Education regarding symptom management and diagnosis given to patient on AVS.  Continue to follow with Dale Mantua, MD for routine health maintenance.   Jonnie Kind and I agreed with plan.   Rennie Plowman, FNP

## 2016-12-19 NOTE — Progress Notes (Signed)
Pre visit review using our clinic review tool, if applicable. No additional management support is needed unless otherwise documented below in the visit note. 

## 2016-12-19 NOTE — Patient Instructions (Signed)
shingrex series vaccine once rash has healed  Let us know if you need anything for pain   Shingles Shingles, which is also known as herpes zoster, is an infection that causes a painful skin rash and fluid-filled blisters. Shingles is not related to genital herpes, which is a sexually transmitted infection. Shingles only develops in people who:  Have had chickenpox.  Have received the chickenpox vaccine. (This is rare.) What are the causes? Shingles is caused by varicella-zoster virus (VZV). This is the same virus that causes chickenpox. After exposure to VZV, the virus stays in the body in an inactive (dormant) state. Shingles develops if the virus reactivates. This can happen many years after the initial exposure to VZV. It is not known what causes this virus to reactivate. What increases the risk? People who have had chickenpox or received the chickenpox vaccine are at risk for shingles. Infection is more common in people who:  Are older than age 96.  Have a weakened defense (immune) system, such as those with HIV, AIDS, or cancer.  Are taking medicines that weaken the immune system, such as transplant medicines.  Are under great stress. What are the signs or symptoms? Early symptoms of this condition include itching, tingling, and pain in an area on your skin. Pain may be described as burning, stabbing, or throbbing. A few days or weeks after symptoms start, a painful red rash appears, usually on one side of the body in a bandlike or beltlike pattern. The rash eventually turns into fluid-filled blisters that break open, scab over, and dry up in about 2-3 weeks. At any time during the infection, you may also develop:  A fever.  Chills.  A headache.  An upset stomach. How is this diagnosed? This condition is diagnosed with a skin exam. Sometimes, skin or fluid samples are taken from the blisters before a diagnosis is made. These samples are examined under a microscope or sent to  a lab for testing. How is this treated? There is no specific cure for this condition. Your health care provider will probably prescribe medicines to help you manage pain, recover more quickly, and avoid long-term problems. Medicines may include:  Antiviral drugs.  Anti-inflammatory drugs.  Pain medicines. If the area involved is on your face, you may be referred to a specialist, such as an eye doctor (ophthalmologist) or an ear, nose, and throat (ENT) doctor to help you avoid eye problems, chronic pain, or disability. Follow these instructions at home: Medicines   Take medicines only as directed by your health care provider.  Apply an anti-itch or numbing cream to the affected area as directed by your health care provider. Blister and Rash Care   Take a cool bath or apply cool compresses to the area of the rash or blisters as directed by your health care provider. This may help with pain and itching.  Keep your rash covered with a loose bandage (dressing). Wear loose-fitting clothing to help ease the pain of material rubbing against the rash.  Keep your rash and blisters clean with mild soap and cool water or as directed by your health care provider.  Check your rash every day for signs of infection. These include redness, swelling, and pain that lasts or increases.  Do not pick your blisters.  Do not scratch your rash. General instructions   Rest as directed by your health care provider.  Keep all follow-up visits as directed by your health care provider. This is important.  Until your  blisters scab over, your infection can cause chickenpox in people who have never had it or been vaccinated against it. To prevent this from happening, avoid contact with other people, especially:  Babies.  Pregnant women.  Children who have eczema.  Elderly people who have transplants.  People who have chronic illnesses, such as leukemia or AIDS. Contact a health care provider if:  Your  pain is not relieved with prescribed medicines.  Your pain does not get better after the rash heals.  Your rash looks infected. Signs of infection include redness, swelling, and pain that lasts or increases. Get help right away if:  The rash is on your face or nose.  You have facial pain, pain around your eye area, or loss of feeling on one side of your face.  You have ear pain or you have ringing in your ear.  You have loss of taste.  Your condition gets worse. This information is not intended to replace advice given to you by your health care provider. Make sure you discuss any questions you have with your health care provider. Document Released: 08/14/2005 Document Revised: 04/09/2016 Document Reviewed: 06/25/2014 Elsevier Interactive Patient Education  2017 [employer].

## 2016-12-20 ENCOUNTER — Other Ambulatory Visit (INDEPENDENT_AMBULATORY_CARE_PROVIDER_SITE_OTHER): Payer: Federal, State, Local not specified - PPO

## 2016-12-20 DIAGNOSIS — E7800 Pure hypercholesterolemia, unspecified: Secondary | ICD-10-CM

## 2016-12-20 DIAGNOSIS — M25571 Pain in right ankle and joints of right foot: Secondary | ICD-10-CM

## 2016-12-20 LAB — HEPATIC FUNCTION PANEL
ALBUMIN: 4 g/dL (ref 3.5–5.2)
ALK PHOS: 52 U/L (ref 39–117)
ALT: 24 U/L (ref 0–35)
AST: 23 U/L (ref 0–37)
Bilirubin, Direct: 0.1 mg/dL (ref 0.0–0.3)
TOTAL PROTEIN: 6.5 g/dL (ref 6.0–8.3)
Total Bilirubin: 0.6 mg/dL (ref 0.2–1.2)

## 2016-12-20 LAB — LIPID PANEL
CHOLESTEROL: 196 mg/dL (ref 0–200)
HDL: 91.5 mg/dL (ref >39.00)
LDL Cholesterol: 93 mg/dL (ref 0–99)
NONHDL: 104.72
Total CHOL/HDL Ratio: 2
Triglycerides: 58 mg/dL (ref 0.0–149.0)
VLDL: 11.6 mg/dL (ref 0.0–40.0)

## 2016-12-20 LAB — BASIC METABOLIC PANEL
BUN: 24 mg/dL — ABNORMAL HIGH (ref 6–23)
CHLORIDE: 106 meq/L (ref 96–112)
CO2: 29 meq/L (ref 19–32)
CREATININE: 0.72 mg/dL (ref 0.40–1.20)
Calcium: 8.9 mg/dL (ref 8.4–10.5)
GFR: 84.48 mL/min (ref >60.00)
Glucose, Bld: 93 mg/dL (ref 70–99)
Potassium: 4.2 mEq/L (ref 3.5–5.1)
SODIUM: 140 meq/L (ref 135–145)

## 2016-12-26 ENCOUNTER — Ambulatory Visit (INDEPENDENT_AMBULATORY_CARE_PROVIDER_SITE_OTHER): Payer: Federal, State, Local not specified - PPO | Admitting: Internal Medicine

## 2016-12-26 ENCOUNTER — Encounter: Payer: Self-pay | Admitting: Internal Medicine

## 2016-12-26 ENCOUNTER — Encounter: Payer: Federal, State, Local not specified - PPO | Admitting: Internal Medicine

## 2016-12-26 VITALS — BP 118/76 | HR 74 | Temp 98.6°F | Resp 12 | Ht 61.0 in | Wt 136.2 lb

## 2016-12-26 DIAGNOSIS — M8580 Other specified disorders of bone density and structure, unspecified site: Secondary | ICD-10-CM | POA: Diagnosis not present

## 2016-12-26 DIAGNOSIS — Z0000 Encounter for general adult medical examination without abnormal findings: Secondary | ICD-10-CM

## 2016-12-26 DIAGNOSIS — B029 Zoster without complications: Secondary | ICD-10-CM | POA: Diagnosis not present

## 2016-12-26 DIAGNOSIS — E7800 Pure hypercholesterolemia, unspecified: Secondary | ICD-10-CM

## 2016-12-26 DIAGNOSIS — E559 Vitamin D deficiency, unspecified: Secondary | ICD-10-CM

## 2016-12-26 MED ORDER — GABAPENTIN 100 MG PO CAPS: 100.0000 mg | ORAL_CAPSULE | Freq: Two times a day (BID) | ORAL | 1 refills | Status: DC | PRN

## 2016-12-26 MED ORDER — SIMVASTATIN 10 MG PO TABS: 10.0000 mg | ORAL_TABLET | Freq: Every day | ORAL | 1 refills | Status: DC

## 2016-12-26 MED ORDER — MELOXICAM 7.5 MG PO TABS: ORAL_TABLET | ORAL | 0 refills | Status: DC

## 2016-12-26 NOTE — Assessment & Plan Note (Addendum)
Physical today 12/26/16.  Colonoscopy 10/24/06.  Due f/u this year.  Just saw GI.  Planning for f/u colonoscopy 03/07/17.  Mammogram 10/05/16 - Birads I.

## 2016-12-26 NOTE — Progress Notes (Signed)
Pre-visit discussion using our clinic review tool. No additional management support is needed unless otherwise documented below in the visit note.  

## 2016-12-26 NOTE — Progress Notes (Signed)
Patient ID: Vickie Crawford, female   DOB: [birth year], 71 y.o.   MRN: [id number]   Subjective:    Patient ID: Vickie Crawford, female    DOB: [birth year], 71 y.o.   MRN: [id number]  HPI  Patient here for her physical exam and also to f/u on shingles.  She saw Rennie Plowman 12/19/16.  Diagnosed with shingles.  Placed on valtrex.  Note reviewed.  Still with increased pain.  Discussed with her regarding pain medication.  Discussed gabapentin.  She states otherwise doing well.  Stays active.  Plays golf.  No chest pain.  No sob.  No acid reflux.  No abdominal pain or cramping.  Bowels stable.  Discussed her recent lab results.  LDL 93.     Past Medical History:  Diagnosis Date  . Allergy   . Hypercholesterolemia   . Ovarian cyst    Hospitalized 1969 and 1974 for removal  . Thyroid nodule   . Vitamin D deficiency    Past Surgical History:  Procedure Laterality Date  . OVARIAN CYST REMOVAL     1969 and 1974   Family History  Problem Relation Age of Onset  . Hypertension Mother   . Heart disease Mother   . Stroke Father   . Brain cancer Sister    Social History   Social History  . Marital status: Unknown    Spouse name: N/A  . Number of children: 2  . Years of education: N/A   Social History Main Topics  . Smoking status: Former Smoker    Types: Cigarettes  . Smokeless tobacco: Never Used  . Alcohol use 0.0 oz/week  . Drug use: No  . Sexual activity: Not Asked   Other Topics Concern  . None   Social History Narrative  . None    Outpatient Encounter Prescriptions as of 12/26/2016  Medication Sig  . Calcium Carbonate-Vitamin D (CALTRATE 600+D PO) Take 1 tablet by mouth 2 (two) times daily.  . cholecalciferol (VITAMIN D) 1000 UNITS tablet Take 1,000 Units by mouth daily.  Marland Kitchen desonide (DESOWEN) 0.05 % ointment at bedtime.  . fexofenadine (ALLEGRA) 180 MG tablet Take 180 mg by mouth daily.  . fluticasone (FLONASE) 50 MCG/ACT nasal spray PLACE 2 SPRAYS INTO BOTH NOSTRILS DAILY.  Marland Kitchen  latanoprost (XALATAN) 0.005 % ophthalmic solution INSTILL ONE DROP IN BOTH EYES AT BEDTIME.  . meloxicam (MOBIC) 7.5 MG tablet TAKE 1 TABLET (7.5 MG TOTAL) BY MOUTH DAILY AS NEEDED.  Marland Kitchen pyridOXINE (VITAMIN B-6) 100 MG tablet Take 100 mg by mouth daily.  . simvastatin (ZOCOR) 10 MG tablet Take 1 tablet (10 mg total) by mouth at bedtime.  . [DISCONTINUED] meloxicam (MOBIC) 7.5 MG tablet TAKE 1 TABLET (7.5 MG TOTAL) BY MOUTH DAILY AS NEEDED.  . [DISCONTINUED] simvastatin (ZOCOR) 10 MG tablet Take 1 tablet (10 mg total) by mouth at bedtime.  . gabapentin (NEURONTIN) 100 MG capsule Take 1 capsule (100 mg total) by mouth 2 (two) times daily as needed.  . timolol (TIMOPTIC) 0.5 % ophthalmic solution   . valACYclovir (VALTREX) 1000 MG tablet Take 1 tablet (1,000 mg total) by mouth 3 (three) times daily. (Patient not taking: Reported on 12/26/2016)   No facility-administered encounter medications on file as of 12/26/2016.     Review of Systems  Constitutional: Negative for appetite change and unexpected weight change.  HENT: Negative for congestion and sinus pressure.   Eyes: Negative for pain and visual disturbance.  Respiratory: Negative for cough, chest tightness and  shortness of breath.   Cardiovascular: Negative for chest pain, palpitations and leg swelling.  Gastrointestinal: Negative for abdominal pain, diarrhea, nausea and vomiting.  Genitourinary: Negative for difficulty urinating and dysuria.  Musculoskeletal: Negative for back pain and joint swelling.  Skin: Positive for rash. Negative for color change.       Rash c/w shingles with pain following nerve.    Neurological: Negative for dizziness, light-headedness and headaches.  Hematological: Negative for adenopathy. Does not bruise/bleed easily.  Psychiatric/Behavioral: Negative for agitation and dysphoric mood.       Objective:    Physical Exam  Constitutional: She is oriented to person, place, and time. She appears well-developed and  well-nourished. No distress.  HENT:  Nose: Nose normal.  Mouth/Throat: Oropharynx is clear and moist.  Eyes: Right eye exhibits no discharge. Left eye exhibits no discharge. No scleral icterus.  Neck: Neck supple. No thyromegaly present.  Cardiovascular: Normal rate and regular rhythm.   Pulmonary/Chest: Breath sounds normal. No accessory muscle usage. No tachypnea. No respiratory distress. She has no decreased breath sounds. She has no wheezes. She has no rhonchi. Right breast exhibits no inverted nipple, no mass, no nipple discharge and no tenderness (no axillary adenopathy). Left breast exhibits no inverted nipple, no mass, no nipple discharge and no tenderness (no axilarry adenopathy).  Abdominal: Soft. Bowel sounds are normal. There is no tenderness.  Musculoskeletal: She exhibits no edema or tenderness.  Lymphadenopathy:    She has no cervical adenopathy.  Neurological: She is alert and oriented to person, place, and time.  Skin: Skin is warm. Rash noted. No erythema.  Rash c/w shingles - under breast and extends around posteriorly.    Psychiatric: She has a normal mood and affect. Her behavior is normal.    BP 118/76 (BP Location: Left Arm, Patient Position: Sitting, Cuff Size: Normal)   Pulse 74   Temp 98.6 F (37 C) (Oral)   Resp 12   Ht  (1.549 m)   Wt 136 lb 3.2 oz (61.8 kg)   SpO2 96%   BMI 25.73 kg/m  Wt Readings from Last 3 Encounters:  12/26/16 136 lb 3.2 oz (61.8 kg)  12/19/16 136 lb 9.6 oz (62 kg)  06/23/16 134 lb 12.8 oz (61.1 kg)     Lab Results  Component Value Date   WBC 5.6 12/21/2015   HGB 15.5 (H) 12/21/2015   HCT 46.5 (H) 12/21/2015   PLT 254.0 12/21/2015   GLUCOSE 93 12/20/2016   CHOL 196 12/20/2016   TRIG 58.0 12/20/2016   HDL 91.50 12/20/2016   LDLCALC 93 12/20/2016   ALT 24 12/20/2016   AST 23 12/20/2016   NA 140 12/20/2016   K 4.2 12/20/2016   CL 106 12/20/2016   CREATININE 0.72 12/20/2016   BUN 24 (H) 12/20/2016   CO2 29  12/20/2016   TSH 2.00 06/21/2016    Mm Digital Screening Bilateral  Result Date: 10/05/2016 CLINICAL DATA:  Screening. EXAM: DIGITAL SCREENING BILATERAL MAMMOGRAM WITH CAD COMPARISON:  Previous exam(s). ACR Breast Density Category b: There are scattered areas of fibroglandular density. FINDINGS: There are no findings suspicious for malignancy. Images were processed with CAD. IMPRESSION: No mammographic evidence of malignancy. A result letter of this screening mammogram will be mailed directly to the patient. RECOMMENDATION: Screening mammogram in one year. (Code:SM-B-01Y) BI-RADS CATEGORY  1: Negative. Electronically Signed   By: Dalphine Handing M.D.   On: 10/05/2016 13:39       Assessment & Plan:  Problem List Items Addressed This Visit    Health care maintenance    Physical today 12/26/16.  Colonoscopy 10/24/06.  Due f/u this year.  Just saw GI.  Planning for f/u colonoscopy 03/07/17.  Mammogram 10/05/16 - Birads I.       Osteopenia    Continue calcium, vitamin D and weight bearing exercise.        Pure hypercholesterolemia    On simvastatin.  Low cholesterol diet and exercise.  Follow lipid panel and liver function tests.        Relevant Medications   simvastatin (ZOCOR) 10 MG tablet   Vitamin D deficiency    Continue vitamin D supplements.  Follow vitamin D level.        Other Visit Diagnoses    Herpes zoster without complication    -  Primary   Treated with valtrex.  discussed pain medication.  gabapentin as directed.  titrate up as needed.     Routine general medical examination at a health care facility           Dale Konterra, MD

## 2016-12-27 ENCOUNTER — Encounter: Payer: Federal, State, Local not specified - PPO | Admitting: Internal Medicine

## 2016-12-31 ENCOUNTER — Encounter: Payer: Self-pay | Admitting: Internal Medicine

## 2016-12-31 NOTE — Assessment & Plan Note (Signed)
Continue vitamin D supplements.  Follow vitamin D level.   

## 2016-12-31 NOTE — Assessment & Plan Note (Signed)
Continue calcium, vitamin D and weight bearing exercise.  

## 2016-12-31 NOTE — Assessment & Plan Note (Signed)
On simvastatin.  Low cholesterol diet and exercise.  Follow lipid panel and liver function tests.   

## 2017-01-02 ENCOUNTER — Telehealth: Payer: Self-pay | Admitting: Internal Medicine

## 2017-01-02 DIAGNOSIS — E7800 Pure hypercholesterolemia, unspecified: Secondary | ICD-10-CM

## 2017-01-02 DIAGNOSIS — E041 Nontoxic single thyroid nodule: Secondary | ICD-10-CM

## 2017-01-02 DIAGNOSIS — E559 Vitamin D deficiency, unspecified: Secondary | ICD-10-CM

## 2017-01-02 DIAGNOSIS — M8580 Other specified disorders of bone density and structure, unspecified site: Secondary | ICD-10-CM

## 2017-01-02 NOTE — Telephone Encounter (Signed)
Pt called requesting labs to be ordered before her 07/10/17 appt. Please advise, thank you!

## 2017-01-02 NOTE — Telephone Encounter (Signed)
Labs ordered.  Please schedule fasting lab appt 1-2 days before 07/10/17 appt.  Thanks

## 2017-01-02 NOTE — Telephone Encounter (Signed)
Please advise 

## 2017-03-04 ENCOUNTER — Other Ambulatory Visit: Payer: Self-pay | Admitting: Internal Medicine

## 2017-03-06 ENCOUNTER — Encounter: Payer: Self-pay | Admitting: *Deleted

## 2017-03-07 ENCOUNTER — Encounter
Admission: RE | Disposition: A | Discharge status: Home / Self Care | Payer: Self-pay | Source: Ambulatory Visit | Attending: Unknown Physician Specialty

## 2017-03-07 ENCOUNTER — Ambulatory Visit: Payer: Federal, State, Local not specified - PPO | Admitting: Anesthesiology

## 2017-03-07 ENCOUNTER — Encounter: Payer: Self-pay | Admitting: *Deleted

## 2017-03-07 ENCOUNTER — Ambulatory Visit
Admission: RE | Admit: 2017-03-07 | Discharge: 2017-03-07 | Disposition: A | Discharge status: Home / Self Care | Payer: Federal, State, Local not specified - PPO | Source: Ambulatory Visit | Attending: Unknown Physician Specialty | Admitting: Unknown Physician Specialty

## 2017-03-07 DIAGNOSIS — Z79899 Other long term (current) drug therapy: Secondary | ICD-10-CM | POA: Insufficient documentation

## 2017-03-07 DIAGNOSIS — Z1211 Encounter for screening for malignant neoplasm of colon: Secondary | ICD-10-CM | POA: Insufficient documentation

## 2017-03-07 DIAGNOSIS — E559 Vitamin D deficiency, unspecified: Secondary | ICD-10-CM | POA: Insufficient documentation

## 2017-03-07 DIAGNOSIS — Z87891 Personal history of nicotine dependence: Secondary | ICD-10-CM | POA: Diagnosis not present

## 2017-03-07 DIAGNOSIS — E7800 Pure hypercholesterolemia, unspecified: Secondary | ICD-10-CM | POA: Diagnosis not present

## 2017-03-07 DIAGNOSIS — K640 First degree hemorrhoids: Secondary | ICD-10-CM | POA: Diagnosis not present

## 2017-03-07 HISTORY — PX: COLONOSCOPY WITH PROPOFOL: SHX5780

## 2017-03-07 HISTORY — DX: Other specified disorders of bone density and structure, unspecified site: M85.80

## 2017-03-07 LAB — HM COLONOSCOPY

## 2017-03-07 SURGERY — COLONOSCOPY WITH PROPOFOL
Anesthesia: General

## 2017-03-07 MED ORDER — FENTANYL CITRATE (PF) 100 MCG/2ML IJ SOLN
INTRAMUSCULAR | Status: AC
  Filled 2017-03-07: qty 2

## 2017-03-07 MED ORDER — PROPOFOL 500 MG/50ML IV EMUL
INTRAVENOUS | Status: AC
  Filled 2017-03-07: qty 50

## 2017-03-07 MED ORDER — PROPOFOL 10 MG/ML IV BOLUS
INTRAVENOUS | Status: DC | PRN
  Administered 2017-03-07: 100 mg via INTRAVENOUS

## 2017-03-07 MED ORDER — LIDOCAINE HCL (PF) 2 % IJ SOLN
INTRAMUSCULAR | Status: AC
  Filled 2017-03-07: qty 12

## 2017-03-07 MED ORDER — PHENYLEPHRINE HCL 10 MG/ML IJ SOLN
INTRAMUSCULAR | Status: DC | PRN
  Administered 2017-03-07 (×2): 100 ug via INTRAVENOUS

## 2017-03-07 MED ORDER — SODIUM CHLORIDE 0.9 % IV SOLN: INTRAVENOUS | Status: DC

## 2017-03-07 MED ORDER — SODIUM CHLORIDE 0.9 % IV SOLN
INTRAVENOUS | Status: DC
  Administered 2017-03-07: 14:00:00 via INTRAVENOUS
  Administered 2017-03-07: 1000 mL via INTRAVENOUS

## 2017-03-07 MED ORDER — PROPOFOL 10 MG/ML IV BOLUS
INTRAVENOUS | Status: AC
  Filled 2017-03-07: qty 20

## 2017-03-07 MED ORDER — FENTANYL CITRATE (PF) 100 MCG/2ML IJ SOLN
INTRAMUSCULAR | Status: DC | PRN
  Administered 2017-03-07: 50 ug via INTRAVENOUS

## 2017-03-07 MED ORDER — LIDOCAINE 2% (20 MG/ML) 5 ML SYRINGE
INTRAMUSCULAR | Status: DC | PRN
  Administered 2017-03-07: 40 mg via INTRAVENOUS

## 2017-03-07 MED ORDER — PROPOFOL 500 MG/50ML IV EMUL
INTRAVENOUS | Status: DC | PRN
  Administered 2017-03-07: 140 ug/kg/min via INTRAVENOUS

## 2017-03-07 MED ORDER — LIDOCAINE 2% (20 MG/ML) 5 ML SYRINGE: INTRAMUSCULAR | Status: DC | PRN

## 2017-03-07 NOTE — Anesthesia Preprocedure Evaluation (Signed)
Anesthesia Evaluation  Patient identified by MRN, date of birth, ID band Patient awake    Reviewed: Allergy & Precautions, NPO status , Patient's Chart, lab work & pertinent test results  Airway Mallampati: II       Dental  (+) Teeth Intact   Pulmonary neg pulmonary ROS, former smoker,    breath sounds clear to auscultation       Cardiovascular Exercise Tolerance: Good  Rhythm:Regular     Neuro/Psych negative neurological ROS  negative psych ROS   GI/Hepatic negative GI ROS, Neg liver ROS,   Endo/Other  negative endocrine ROS  Renal/GU negative Renal ROS     Musculoskeletal negative musculoskeletal ROS (+)   Abdominal Normal abdominal exam  (+)   Peds  Hematology negative hematology ROS (+)   Anesthesia Other Findings   Reproductive/Obstetrics                             Anesthesia Physical Anesthesia Plan  ASA: II  Anesthesia Plan: General   Post-op Pain Management:    Induction: Intravenous  PONV Risk Score and Plan: 0  Airway Management Planned: Natural Airway and Nasal Cannula  Additional Equipment:   Intra-op Plan:   Post-operative Plan:   Informed Consent: I have reviewed the patients History and Physical, chart, labs and discussed the procedure including the risks, benefits and alternatives for the proposed anesthesia with the patient or authorized representative who has indicated his/her understanding and acceptance.     Plan Discussed with: CRNA  Anesthesia Plan Comments:         Anesthesia Quick Evaluation

## 2017-03-07 NOTE — H&P (Signed)
Primary Care Physician:  Dale Cumming, MD Primary Gastroenterologist:  Dr. Mechele Collin  Pre-Procedure History & Physical: HPI:  Vickie Crawford is a 71 y.o. female is here for an colonoscopy.   Past Medical History:  Diagnosis Date  . Allergy   . Hypercholesterolemia   . Osteopenia   . Ovarian cyst    Hospitalized 1969 and 1974 for removal  . Thyroid nodule   . Vitamin D deficiency     Past Surgical History:  Procedure Laterality Date  . COLONOSCOPY W/ POLYPECTOMY    . OVARIAN CYST REMOVAL     1969 and 1974    Prior to Admission medications   Medication Sig Start Date End Date Taking? Authorizing Provider  Calcium Carbonate-Vitamin D (CALTRATE 600+D PO) Take 1 tablet by mouth 2 (two) times daily.   Yes [provider]  cholecalciferol (VITAMIN D) 1000 UNITS tablet Take 1,000 Units by mouth daily.   Yes [provider]  desonide (DESOWEN) 0.05 % ointment at bedtime. 10/29/14  Yes [provider]  fexofenadine (ALLEGRA) 180 MG tablet Take 180 mg by mouth daily.   Yes [provider]  fluticasone (FLONASE) 50 MCG/ACT nasal spray PLACE 2 SPRAYS INTO BOTH NOSTRILS DAILY. 05/02/16  Yes Dale Dickinson, MD  gabapentin (NEURONTIN) 100 MG capsule Take 1 capsule (100 mg total) by mouth 2 (two) times daily as needed. 12/26/16  Yes Dale Tuscola, MD  latanoprost (XALATAN) 0.005 % ophthalmic solution INSTILL ONE DROP IN BOTH EYES AT BEDTIME. 06/18/15  Yes [provider]  meloxicam (MOBIC) 7.5 MG tablet TAKE 1 TABLET (7.5 MG TOTAL) BY MOUTH DAILY AS NEEDED. 12/26/16  Yes Dale Newhalen, MD  meloxicam (MOBIC) 7.5 MG tablet TAKE 1 TABLET (7.5 MG TOTAL) BY MOUTH DAILY AS NEEDED. 03/05/17  Yes Dale Grampian, MD  pyridOXINE (VITAMIN B-6) 100 MG tablet Take 100 mg by mouth daily.   Yes [provider]  simvastatin (ZOCOR) 10 MG tablet Take 1 tablet (10 mg total) by mouth at bedtime. 12/26/16  Yes Dale , MD  timolol (TIMOPTIC) 0.5 % ophthalmic  solution  12/06/16  Yes [provider]  valACYclovir (VALTREX) 1000 MG tablet Take 1 tablet (1,000 mg total) by mouth 3 (three) times daily. Patient not taking: Reported on 12/26/2016 12/19/16   Allegra Grana, FNP    Allergies as of 12/22/2016  . (No Known Allergies)    Family History  Problem Relation Age of Onset  . Hypertension Mother   . Heart disease Mother   . Stroke Father   . Brain cancer Sister     Social History   Social History  . Marital status: Married    Spouse name: N/A  . Number of children: 2  . Years of education: N/A   Occupational History  . Not on file.   Social History Main Topics  . Smoking status: Former Smoker    Types: Cigarettes  . Smokeless tobacco: Never Used  . Alcohol use 1.2 oz/week    2 Glasses of wine per week  . Drug use: No  . Sexual activity: Not on file   Other Topics Concern  . Not on file   Social History Narrative  . No narrative on file    Review of Systems: See HPI, otherwise negative ROS  Physical Exam: BP 125/74   Pulse 74   Temp (!) 97.5 F (36.4 C) (Tympanic)   Resp 20   Ht 5\' 1"  (1.549 m)   Wt 59.4 kg (131  lb)   SpO2 96%   BMI 24.75 kg/m  General:   Alert,  pleasant and cooperative in NAD Head:  Normocephalic and atraumatic. Neck:  Supple; no masses or thyromegaly. Lungs:  Clear throughout to auscultation.    Heart:  Regular rate and rhythm. Abdomen:  Soft, nontender and nondistended. Normal bowel sounds, without guarding, and without rebound.   Neurologic:  Alert and  oriented x4;  grossly normal neurologically.  Impression/Plan: Vickie Crawford is here for an colonoscopy to be performed for screening colonoscopy  Risks, benefits, limitations, and alternatives regarding  colonoscopy have been reviewed with the patient.  Questions have been answered.  All parties agreeable.   Lynnae PrudeELLIOTT, Shunna Mikaelian, MD  03/07/2017, 1:54 PM

## 2017-03-07 NOTE — Transfer of Care (Signed)
Immediate Anesthesia Transfer of Care Note  Patient: Vickie KindSue H Switala  Procedure(s) Performed: Procedure(s): COLONOSCOPY WITH PROPOFOL (N/A)  Patient Location: PACU and Endoscopy Unit  Anesthesia Type:General  Level of Consciousness: drowsy  Airway & Oxygen Therapy: Patient Spontanous Breathing and Patient connected to nasal cannula oxygen  Post-op Assessment: Report given to RN and Post -op Vital signs reviewed and stable  Post vital signs: Reviewed and stable  Last Vitals:  Vitals:   03/07/17 1249  BP: 125/74  Pulse: 74  Resp: 20  Temp: (!) 36.4 C    Last Pain:  Vitals:   03/07/17 1249  TempSrc: Tympanic      Patients Stated Pain Goal: 0 (03/07/17 1249)  Complications: No apparent anesthesia complications

## 2017-03-07 NOTE — Anesthesia Post-op Follow-up Note (Cosign Needed)
Anesthesia QCDR form completed.        

## 2017-03-07 NOTE — Op Note (Signed)
[facility] Gastroenterology Patient Name: Vickie BerlinSue Bogard Procedure Date: 03/07/2017 2:07 PM MRN: [id number] Account #: [id number] Date of Birth: 1945-12-24 Admit Type: Outpatient Age: [id number] [facility] 3 Gender: Female Note Status: Finalized Procedure:            Colonoscopy Indications:          Screening for colorectal malignant neoplasm Providers:            Scot Junobert T. Renny Gunnarson, MD Referring MD:         Dale Durhamharlene Scott, MD (Referring MD) Medicines:            Propofol per Anesthesia Complications:        No immediate complications. Procedure:            Pre-Anesthesia Assessment:                       - After reviewing the risks and benefits, the patient                        was deemed in satisfactory condition to undergo the                        procedure.                       After obtaining informed consent, the colonoscope was                        passed under direct vision. Throughout the procedure,                        the patient's blood pressure, pulse, and oxygen                        saturations were monitored continuously. The                        Colonoscope was introduced through the anus and                        advanced to the the cecum, identified by appendiceal                        orifice and ileocecal valve. The colonoscopy was                        performed without difficulty. The patient tolerated the                        procedure well. The quality of the bowel preparation                        was excellent. Findings:      Internal hemorrhoids were found during endoscopy. The hemorrhoids were       small and Grade I (internal hemorrhoids that do not prolapse).      The exam was otherwise without abnormality. Impression:           - Internal hemorrhoids.                       - The examination was otherwise  normal.                       - No specimens collected. Recommendation:       - Repeat colonoscopy in 10 years  for screening purposes. Scot Jun, MD 03/07/2017 2:28:28 PM This report has been signed electronically. Number of Addenda: 0 Note Initiated On: 03/07/2017 2:07 PM Scope Withdrawal Time: 0 hours 7 minutes 24 seconds  Total Procedure Duration: 0 hours 13 minutes 31 seconds       [facility]

## 2017-03-08 ENCOUNTER — Encounter: Payer: Self-pay | Admitting: Unknown Physician Specialty

## 2017-03-12 NOTE — Anesthesia Postprocedure Evaluation (Signed)
Anesthesia Post Note  Patient: Vickie Crawford  Procedure(s) Performed: Procedure(s) (LRB): COLONOSCOPY WITH PROPOFOL (N/A)  Patient location during evaluation: PACU Anesthesia Type: General Level of consciousness: awake Pain management: pain level controlled Vital Signs Assessment: post-procedure vital signs reviewed and stable Respiratory status: spontaneous breathing Cardiovascular status: stable Anesthetic complications: no     Last Vitals:  Vitals:   03/07/17 1450 03/07/17 1500  BP: 115/85 124/66  Pulse:    Resp:    Temp:      Last Pain:  Vitals:   03/07/17 1430  TempSrc: Tympanic                 VAN STAVEREN,[name]

## 2017-05-13 ENCOUNTER — Encounter: Payer: Self-pay | Admitting: Internal Medicine

## 2017-05-23 ENCOUNTER — Other Ambulatory Visit: Payer: Self-pay | Admitting: Internal Medicine

## 2017-06-27 ENCOUNTER — Other Ambulatory Visit: Payer: Self-pay | Admitting: Internal Medicine

## 2017-07-03 ENCOUNTER — Other Ambulatory Visit (INDEPENDENT_AMBULATORY_CARE_PROVIDER_SITE_OTHER): Payer: Federal, State, Local not specified - PPO

## 2017-07-03 DIAGNOSIS — E7800 Pure hypercholesterolemia, unspecified: Secondary | ICD-10-CM | POA: Diagnosis not present

## 2017-07-03 DIAGNOSIS — E559 Vitamin D deficiency, unspecified: Secondary | ICD-10-CM

## 2017-07-03 DIAGNOSIS — E041 Nontoxic single thyroid nodule: Secondary | ICD-10-CM | POA: Diagnosis not present

## 2017-07-03 LAB — CBC WITH DIFFERENTIAL/PLATELET
BASOS PCT: 1.9 % (ref 0.0–3.0)
Basophils Absolute: 0.1 10*3/uL (ref 0.0–0.1)
EOS ABS: 0.2 10*3/uL (ref 0.0–0.7)
EOS PCT: 3.5 % (ref 0.0–5.0)
HCT: 45.9 % (ref 36.0–46.0)
HEMOGLOBIN: 15.2 g/dL — AB (ref 12.0–15.0)
LYMPHS ABS: 1.6 10*3/uL (ref 0.7–4.0)
Lymphocytes Relative: 32.5 % (ref 12.0–46.0)
MCHC: 33.2 g/dL (ref 30.0–36.0)
MCV: 91.9 fl (ref 78.0–100.0)
MONO ABS: 0.4 10*3/uL (ref 0.1–1.0)
MONOS PCT: 9 % (ref 3.0–12.0)
NEUTROS ABS: 2.6 10*3/uL (ref 1.4–7.7)
NEUTROS PCT: 53.1 % (ref 43.0–77.0)
Platelets: 256 10*3/uL (ref 150.0–400.0)
RBC: 5 Mil/uL (ref 3.87–5.11)
RDW: 12.4 % (ref 11.5–15.5)
WBC: 4.8 10*3/uL (ref 4.0–10.5)

## 2017-07-03 LAB — BASIC METABOLIC PANEL
BUN: 20 mg/dL (ref 6–23)
CALCIUM: 9.6 mg/dL (ref 8.4–10.5)
CHLORIDE: 104 meq/L (ref 96–112)
CO2: 31 meq/L (ref 19–32)
Creatinine, Ser: 0.67 mg/dL (ref 0.40–1.20)
GFR: 91.66 mL/min (ref >60.00)
GLUCOSE: 90 mg/dL (ref 70–99)
Potassium: 4.4 mEq/L (ref 3.5–5.1)
SODIUM: 141 meq/L (ref 135–145)

## 2017-07-03 LAB — TSH: TSH: 1.98 u[IU]/mL (ref 0.35–4.50)

## 2017-07-03 LAB — HEPATIC FUNCTION PANEL
ALT: 21 U/L (ref 0–35)
AST: 22 U/L (ref 0–37)
Albumin: 4.1 g/dL (ref 3.5–5.2)
Alkaline Phosphatase: 58 U/L (ref 39–117)
BILIRUBIN TOTAL: 0.8 mg/dL (ref 0.2–1.2)
Bilirubin, Direct: 0.1 mg/dL (ref 0.0–0.3)
Total Protein: 6.4 g/dL (ref 6.0–8.3)

## 2017-07-03 LAB — VITAMIN D 25 HYDROXY (VIT D DEFICIENCY, FRACTURES): VITD: 54.46 ng/mL (ref 30.00–100.00)

## 2017-07-03 LAB — LIPID PANEL
CHOL/HDL RATIO: 2
CHOLESTEROL: 205 mg/dL — AB (ref 0–200)
HDL: 85.4 mg/dL (ref >39.00)
LDL CALC: 108 mg/dL — AB (ref 0–99)
NonHDL: 119.91
TRIGLYCERIDES: 62 mg/dL (ref 0.0–149.0)
VLDL: 12.4 mg/dL (ref 0.0–40.0)

## 2017-07-10 ENCOUNTER — Ambulatory Visit: Payer: Federal, State, Local not specified - PPO | Admitting: Internal Medicine

## 2017-07-10 ENCOUNTER — Other Ambulatory Visit: Payer: Self-pay

## 2017-07-10 ENCOUNTER — Encounter: Payer: Self-pay | Admitting: Internal Medicine

## 2017-07-10 DIAGNOSIS — E559 Vitamin D deficiency, unspecified: Secondary | ICD-10-CM | POA: Diagnosis not present

## 2017-07-10 DIAGNOSIS — E7800 Pure hypercholesterolemia, unspecified: Secondary | ICD-10-CM

## 2017-07-10 MED ORDER — FLUTICASONE PROPIONATE 50 MCG/ACT NA SUSP: 2.0000 | Freq: Every day | NASAL | 2 refills | Status: DC

## 2017-07-10 MED ORDER — SIMVASTATIN 20 MG PO TABS: 20.0000 mg | ORAL_TABLET | Freq: Every day | ORAL | 1 refills | Status: DC

## 2017-07-10 MED ORDER — MELOXICAM 7.5 MG PO TABS: ORAL_TABLET | ORAL | 0 refills | Status: DC

## 2017-07-10 NOTE — Progress Notes (Signed)
Patient ID: Vickie Crawford, female   DOB: 1946/08/23, 71 y.o.   MRN: [id number]   Subjective:    Patient ID: Vickie Crawford, female    DOB: 1946/08/23, 71 y.o.   MRN: [id number]  HPI  Patient here for a scheduled follow up.  She reports she is doing well.  Feels good.  Stays active. Plays golf.  No chest pain.  No sob.  No acid reflux.  No abdominal pain.  Bowels moving.  Discussed recent lab results.  Discussed calculated cholesterol risk.  Discussed increasing her cholesterol medication.     Past Medical History:  Diagnosis Date  . Allergy   . Hypercholesterolemia   . Osteopenia   . Ovarian cyst    Hospitalized 1969 and 1974 for removal  . Thyroid nodule   . Vitamin D deficiency    Past Surgical History:  Procedure Laterality Date  . COLONOSCOPY W/ POLYPECTOMY    . COLONOSCOPY WITH PROPOFOL N/A 03/07/2017   Procedure: COLONOSCOPY WITH PROPOFOL;  Surgeon: Scot JunElliott, Robert T, MD;  [facility]COPY;  Service: Endoscopy;  Laterality: N/A;  . OVARIAN CYST REMOVAL     1969 and 1974   Family History  Problem Relation Age of Onset  . Hypertension Mother   . Heart disease Mother   . Stroke Father   . Brain cancer Sister    Social History   Socioeconomic History  . Marital status: Married    Spouse name: None  . Number of children: 2  . Years of education: None  . Highest education level: None  Social Needs  . Financial resource strain: None  . Food insecurity - worry: None  . Food insecurity - inability: None  . Transportation needs - medical: None  . Transportation needs - non-medical: None  Occupational History  . None  Tobacco Use  . Smoking status: Former Smoker    Types: Cigarettes  . Smokeless tobacco: Never Used  Substance and Sexual Activity  . Alcohol use: Yes    Alcohol/week: 1.2 oz    Types: 2 Glasses of wine per week  . Drug use: No  . Sexual activity: None  Other Topics Concern  . None  Social History Narrative  . None    Outpatient Encounter  Medications as of 07/10/2017  Medication Sig  . Calcium Carbonate-Vitamin D (CALTRATE 600+D PO) Take 1 tablet by mouth 2 (two) times daily.  . cholecalciferol (VITAMIN D) 1000 UNITS tablet Take 1,000 Units by mouth daily.  Marland Kitchen. desonide (DESOWEN) 0.05 % ointment at bedtime.  . fexofenadine (ALLEGRA) 180 MG tablet Take 180 mg by mouth daily.  . fluticasone (FLONASE) 50 MCG/ACT nasal spray Place 2 sprays daily into both nostrils.  Marland Kitchen. latanoprost (XALATAN) 0.005 % ophthalmic solution INSTILL ONE DROP IN BOTH EYES AT BEDTIME.  . meloxicam (MOBIC) 7.5 MG tablet TAKE 1 TABLET (7.5 MG TOTAL) BY MOUTH DAILY AS NEEDED.  Marland Kitchen. pyridOXINE (VITAMIN B-6) 100 MG tablet Take 100 mg by mouth daily.  . timolol (TIMOPTIC) 0.5 % ophthalmic solution   . valACYclovir (VALTREX) 1000 MG tablet Take 1 tablet (1,000 mg total) by mouth 3 (three) times daily.  . [DISCONTINUED] fluticasone (FLONASE) 50 MCG/ACT nasal spray PLACE 2 SPRAYS INTO BOTH NOSTRILS DAILY.  . [DISCONTINUED] gabapentin (NEURONTIN) 100 MG capsule Take 1 capsule (100 mg total) by mouth 2 (two) times daily as needed.  . [DISCONTINUED] meloxicam (MOBIC) 7.5 MG tablet TAKE 1 TABLET (7.5 MG TOTAL) BY MOUTH DAILY AS NEEDED.  . [DISCONTINUED]  meloxicam (MOBIC) 7.5 MG tablet TAKE 1 TABLET (7.5 MG TOTAL) BY MOUTH DAILY AS NEEDED.  . [DISCONTINUED] simvastatin (ZOCOR) 10 MG tablet TAKE 1 TABLET BY MOUTH EVERYDAY AT BEDTIME  . simvastatin (ZOCOR) 20 MG tablet Take 1 tablet (20 mg total) at bedtime by mouth.   No facility-administered encounter medications on file as of 07/10/2017.     Review of Systems  Constitutional: Negative for appetite change and unexpected weight change.  HENT: Negative for congestion and sinus pressure.   Respiratory: Negative for cough, chest tightness and shortness of breath.   Cardiovascular: Negative for chest pain, palpitations and leg swelling.  Gastrointestinal: Negative for abdominal pain, diarrhea, nausea and vomiting.    Genitourinary: Negative for difficulty urinating and dysuria.  Musculoskeletal: Negative for back pain and joint swelling.  Skin: Negative for color change and rash.  Neurological: Negative for dizziness, light-headedness and headaches.  Psychiatric/Behavioral: Negative for agitation and dysphoric mood.       Objective:    Physical Exam  Constitutional: She appears well-developed and well-nourished. No distress.  HENT:  Nose: Nose normal.  Mouth/Throat: Oropharynx is clear and moist.  Neck: Neck supple. No thyromegaly present.  Cardiovascular: Normal rate and regular rhythm.  Pulmonary/Chest: Breath sounds normal. No respiratory distress. She has no wheezes.  Abdominal: Soft. Bowel sounds are normal. There is no tenderness.  Musculoskeletal: She exhibits no edema or tenderness.  Lymphadenopathy:    She has no cervical adenopathy.  Skin: No rash noted. No erythema.  Psychiatric: She has a normal mood and affect. Her behavior is normal.    BP 118/78 (BP Location: Left Arm, Patient Position: Sitting, Cuff Size: Normal)   Pulse 63   Temp 98.6 F (37 C) (Oral)   Resp 14   Ht 5\' 1"  (1.549 m)   Wt 133 lb 9.6 oz (60.6 kg)   SpO2 98%   BMI 25.24 kg/m  Wt Readings from Last 3 Encounters:  07/10/17 133 lb 9.6 oz (60.6 kg)  03/07/17 131 lb (59.4 kg)  12/26/16 136 lb 3.2 oz (61.8 kg)     Lab Results  Component Value Date   WBC 4.8 07/03/2017   HGB 15.2 (H) 07/03/2017   HCT 45.9 07/03/2017   PLT 256.0 07/03/2017   GLUCOSE 90 07/03/2017   CHOL 205 (H) 07/03/2017   TRIG 62.0 07/03/2017   HDL 85.40 07/03/2017   LDLCALC 108 (H) 07/03/2017   ALT 21 07/03/2017   AST 22 07/03/2017   NA 141 07/03/2017   K 4.4 07/03/2017   CL 104 07/03/2017   CREATININE 0.67 07/03/2017   BUN 20 07/03/2017   CO2 31 07/03/2017   TSH 1.98 07/03/2017       Assessment & Plan:   Problem List Items Addressed This Visit    Pure hypercholesterolemia    On simvastatin.  Discussed cholesterol  results and calculated risk. Increased simvastatin to 20mg  q day.  Follow.  Low cholesterol diet and exercise.        Relevant Medications   simvastatin (ZOCOR) 20 MG tablet   Other Relevant Orders   Lipid panel   Hepatic function panel   Basic metabolic panel   Vitamin D deficiency    Follow vitamin D level.           Dale DurhamSCOTT, Scotti Kosta, MD

## 2017-07-13 ENCOUNTER — Encounter: Payer: Self-pay | Admitting: Internal Medicine

## 2017-07-13 NOTE — Assessment & Plan Note (Signed)
On simvastatin.  Discussed cholesterol results and calculated risk. Increased simvastatin to 20mg  q day.  Follow.  Low cholesterol diet and exercise.

## 2017-07-13 NOTE — Assessment & Plan Note (Signed)
Follow vitamin D level.  

## 2017-10-11 ENCOUNTER — Other Ambulatory Visit: Payer: Federal, State, Local not specified - PPO

## 2017-10-15 ENCOUNTER — Ambulatory Visit: Payer: Federal, State, Local not specified - PPO | Admitting: Internal Medicine

## 2017-10-24 ENCOUNTER — Other Ambulatory Visit: Payer: Self-pay | Admitting: Internal Medicine

## 2017-10-24 DIAGNOSIS — Z1231 Encounter for screening mammogram for malignant neoplasm of breast: Secondary | ICD-10-CM

## 2017-11-13 ENCOUNTER — Other Ambulatory Visit: Payer: Self-pay | Admitting: Internal Medicine

## 2017-11-13 ENCOUNTER — Ambulatory Visit
Admission: RE | Admit: 2017-11-13 | Discharge: 2017-11-13 | Disposition: A | Discharge status: Home / Self Care | Payer: Federal, State, Local not specified - PPO | Source: Ambulatory Visit | Attending: Internal Medicine | Admitting: Internal Medicine

## 2017-11-13 DIAGNOSIS — Z1231 Encounter for screening mammogram for malignant neoplasm of breast: Secondary | ICD-10-CM | POA: Diagnosis not present

## 2017-11-14 NOTE — Telephone Encounter (Signed)
Last OV 07/10/2017 Next OV 12/24/2017 Last refill 07/10/2017

## 2017-12-19 ENCOUNTER — Other Ambulatory Visit (INDEPENDENT_AMBULATORY_CARE_PROVIDER_SITE_OTHER): Payer: Federal, State, Local not specified - PPO

## 2017-12-19 DIAGNOSIS — E7800 Pure hypercholesterolemia, unspecified: Secondary | ICD-10-CM | POA: Diagnosis not present

## 2017-12-19 LAB — LIPID PANEL
CHOL/HDL RATIO: 2
Cholesterol: 190 mg/dL (ref 0–200)
HDL: 96.1 mg/dL (ref >39.00)
LDL Cholesterol: 83 mg/dL (ref 0–99)
NonHDL: 93.79
TRIGLYCERIDES: 54 mg/dL (ref 0.0–149.0)
VLDL: 10.8 mg/dL (ref 0.0–40.0)

## 2017-12-19 LAB — BASIC METABOLIC PANEL
BUN: 21 mg/dL (ref 6–23)
CALCIUM: 9 mg/dL (ref 8.4–10.5)
CO2: 28 meq/L (ref 19–32)
CREATININE: 0.65 mg/dL (ref 0.40–1.20)
Chloride: 104 mEq/L (ref 96–112)
GFR: 94.8 mL/min (ref >60.00)
Glucose, Bld: 84 mg/dL (ref 70–99)
Potassium: 4.3 mEq/L (ref 3.5–5.1)
Sodium: 139 mEq/L (ref 135–145)

## 2017-12-19 LAB — HEPATIC FUNCTION PANEL
ALT: 25 U/L (ref 0–35)
AST: 28 U/L (ref 0–37)
Albumin: 3.9 g/dL (ref 3.5–5.2)
Alkaline Phosphatase: 58 U/L (ref 39–117)
BILIRUBIN DIRECT: 0.1 mg/dL (ref 0.0–0.3)
TOTAL PROTEIN: 6.5 g/dL (ref 6.0–8.3)
Total Bilirubin: 0.7 mg/dL (ref 0.2–1.2)

## 2017-12-24 ENCOUNTER — Ambulatory Visit: Payer: Federal, State, Local not specified - PPO | Admitting: Internal Medicine

## 2017-12-24 ENCOUNTER — Encounter

## 2017-12-24 ENCOUNTER — Telehealth: Payer: Self-pay | Admitting: Internal Medicine

## 2017-12-24 VITALS — BP 138/96 | HR 80 | Temp 98.1°F | Resp 18 | Wt 133.6 lb

## 2017-12-24 DIAGNOSIS — R0981 Nasal congestion: Secondary | ICD-10-CM

## 2017-12-24 DIAGNOSIS — E559 Vitamin D deficiency, unspecified: Secondary | ICD-10-CM | POA: Diagnosis not present

## 2017-12-24 DIAGNOSIS — R030 Elevated blood-pressure reading, without diagnosis of hypertension: Secondary | ICD-10-CM | POA: Diagnosis not present

## 2017-12-24 DIAGNOSIS — E7800 Pure hypercholesterolemia, unspecified: Secondary | ICD-10-CM | POA: Diagnosis not present

## 2017-12-24 NOTE — Telephone Encounter (Signed)
Since she just had labs and she is coming back in two months to f/u on her blood pressure, no need for labs.  Too early.

## 2017-12-24 NOTE — Telephone Encounter (Signed)
Pt wanted to know if she needed labs before next appt.  No note on checkout for labs only follow up

## 2017-12-24 NOTE — Progress Notes (Signed)
Patient ID: Vickie Crawford, female   DOB: 1945/10/27, 72 y.o.   MRN: [id number]   Subjective:    Patient ID: Vickie Crawford, female    DOB: April 24, 1946, 72 y.o.   MRN: [id number]  HPI  Patient here for a scheduled follow up.  Recently saw dermatology.  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  Also undergoing root implant.  Two stages.  Noticed some increased drainage and runny nose recently.  No fever.  Minimal increased drainage.  Some sore throat.  Using flonase.  No chest congestion.  No sob or chest pain.  No abdominal pain.  Bowels moving.  Taking mobic.  Has to take to function.     Past Medical History:  Diagnosis Date  . Allergy   . Hypercholesterolemia   . Osteopenia   . Ovarian cyst    Hospitalized 1969 and 1974 for removal  . Thyroid nodule   . Vitamin D deficiency    Past Surgical History:  Procedure Laterality Date  . COLONOSCOPY W/ POLYPECTOMY    . COLONOSCOPY WITH PROPOFOL N/A 03/07/2017   Procedure: COLONOSCOPY WITH PROPOFOL;  Surgeon: Scot Jun, MD;  Location: [facility] ENDOSCOPY;  Service: Endoscopy;  Laterality: N/A;  . OVARIAN CYST REMOVAL     1969 and 1974   Family History  Problem Relation Age of Onset  . Hypertension Mother   . Heart disease Mother   . Stroke Father   . Brain cancer Sister   . Breast cancer Neg Hx    Social History   Socioeconomic History  . Marital status: Married    Spouse name: Not on file  . Number of children: 2  . Years of education: Not on file  . Highest education level: Not on file  Occupational History  . Not on file  Social Needs  . Financial resource strain: Not on file  . Food insecurity:    Worry: Not on file    Inability: Not on file  . Transportation needs:    Medical: Not on file    Non-medical: Not on file  Tobacco Use  . Smoking status: Former Smoker    Types: Cigarettes  . Smokeless tobacco: Never Used  Substance and Sexual Activity  . Alcohol use: Yes    Alcohol/week: 1.2 oz    Types: 2  Glasses of wine per week  . Drug use: No  . Sexual activity: Not on file  Lifestyle  . Physical activity:    Days per week: Not on file    Minutes per session: Not on file  . Stress: Not on file  Relationships  . Social connections:    Talks on phone: Not on file    Gets together: Not on file    Attends religious service: Not on file    Active member of club or organization: Not on file    Attends meetings of clubs or organizations: Not on file    Relationship status: Not on file  Other Topics Concern  . Not on file  Social History Narrative  . Not on file    Outpatient Encounter Medications as of 12/24/2017  Medication Sig  . Calcium Carbonate-Vitamin D (CALTRATE 600+D PO) Take 1 tablet by mouth 2 (two) times daily.  . cholecalciferol (VITAMIN D) 1000 UNITS tablet Take 1,000 Units by mouth daily.  Marland Kitchen desonide (DESOWEN) 0.05 % ointment at bedtime.  . fexofenadine (ALLEGRA) 180 MG tablet Take 180 mg by mouth daily.  Marland Kitchen  fluticasone (FLONASE) 50 MCG/ACT nasal spray Place 2 sprays daily into both nostrils.  Marland Kitchen latanoprost (XALATAN) 0.005 % ophthalmic solution INSTILL ONE DROP IN BOTH EYES AT BEDTIME.  . meloxicam (MOBIC) 7.5 MG tablet TAKE 1 TABLET (7.5 MG TOTAL) BY MOUTH DAILY AS NEEDED.  Marland Kitchen pyridOXINE (VITAMIN B-6) 100 MG tablet Take 100 mg by mouth daily.  . simvastatin (ZOCOR) 20 MG tablet Take 1 tablet (20 mg total) at bedtime by mouth.  . timolol (TIMOPTIC) 0.5 % ophthalmic solution   . [DISCONTINUED] valACYclovir (VALTREX) 1000 MG tablet Take 1 tablet (1,000 mg total) by mouth 3 (three) times daily.   No facility-administered encounter medications on file as of 12/24/2017.     Review of Systems  Constitutional: Negative for appetite change and unexpected weight change.  HENT: Positive for congestion, postnasal drip and sore throat.   Respiratory: Negative for cough, chest tightness and shortness of breath.   Cardiovascular: Negative for chest pain, palpitations and leg  swelling.  Gastrointestinal: Negative for abdominal pain, diarrhea, nausea and vomiting.  Genitourinary: Negative for difficulty urinating and dysuria.  Musculoskeletal: Negative for joint swelling and myalgias.  Skin: Negative for color change and rash.  Neurological: Negative for dizziness, light-headedness and headaches.  Psychiatric/Behavioral: Negative for agitation and dysphoric mood.       Objective:    Physical Exam  Constitutional: She appears well-developed and well-nourished. No distress.  HENT:  Nose: Nose normal.  Mouth/Throat: Oropharynx is clear and moist.  Neck: Neck supple. No thyromegaly present.  Cardiovascular: Normal rate and regular rhythm.  Pulmonary/Chest: Breath sounds normal. No respiratory distress. She has no wheezes.  Abdominal: Soft. Bowel sounds are normal. There is no tenderness.  Musculoskeletal: She exhibits no edema or tenderness.  Lymphadenopathy:    She has no cervical adenopathy.  Skin: No rash noted. No erythema.  Psychiatric: She has a normal mood and affect. Her behavior is normal.    BP (!) 138/96 (BP Location: Left Arm, Patient Position: Sitting, Cuff Size: Normal)   Pulse 80   Temp 98.1 F (36.7 C) (Oral)   Resp 18   Wt 133 lb 9.6 oz (60.6 kg)   SpO2 95%   BMI 25.24 kg/m  Wt Readings from Last 3 Encounters:  12/24/17 133 lb 9.6 oz (60.6 kg)  07/10/17 133 lb 9.6 oz (60.6 kg)  03/07/17 131 lb (59.4 kg)     Lab Results  Component Value Date   WBC 4.8 07/03/2017   HGB 15.2 (H) 07/03/2017   HCT 45.9 07/03/2017   PLT 256.0 07/03/2017   GLUCOSE 84 12/19/2017   CHOL 190 12/19/2017   TRIG 54.0 12/19/2017   HDL 96.10 12/19/2017   LDLCALC 83 12/19/2017   ALT 25 12/19/2017   AST 28 12/19/2017   NA 139 12/19/2017   K 4.3 12/19/2017   CL 104 12/19/2017   CREATININE 0.65 12/19/2017   BUN 21 12/19/2017   CO2 28 12/19/2017   TSH 1.98 07/03/2017    Mm Digital Screening Bilateral  Result Date: 11/14/2017 CLINICAL DATA:   Screening. EXAM: DIGITAL SCREENING BILATERAL MAMMOGRAM WITH CAD COMPARISON:  Previous exam(s). ACR Breast Density Category b: There are scattered areas of fibroglandular density. FINDINGS: There are no findings suspicious for malignancy. Images were processed with CAD. IMPRESSION: No mammographic evidence of malignancy. A result letter of this screening mammogram will be mailed directly to the patient. RECOMMENDATION: Screening mammogram in one year. (Code:SM-B-01Y) BI-RADS CATEGORY  1: Negative. Electronically Signed   By: Ted Mcalpine  M.D.   On: 11/14/2017 13:01       Assessment & Plan:   Problem List Items Addressed This Visit    Elevated blood pressure reading    Blood pressure on recheck improved.  Still elevated.  Have her spot check her pressure.  Get her back in soon to reassess.        Pure hypercholesterolemia    On simvastatin.  Low cholesterol diet and exercise.  Follow lipid panel and liver function tests.  Discussed recent lab results.   Lab Results  Component Value Date   CHOL 190 12/19/2017   HDL 96.10 12/19/2017   LDLCALC 83 12/19/2017   TRIG 54.0 12/19/2017   CHOLHDL 2 12/19/2017        Vitamin D deficiency    Follow vitamin D level.         Other Visit Diagnoses    Nasal congestion    -  Primary   Treat with saline nasal spray, Flonase and robitussin as directed.  follow.         Dale Foraker, MD

## 2017-12-24 NOTE — Patient Instructions (Signed)
Saline nasal spray - flush nose at least 2-3x/day  flonase nasal spray - 2 sprays each nostril one time per day.  Do this in the evening.    Robitussin twice a day as needed.   

## 2017-12-27 NOTE — Telephone Encounter (Signed)
Notified pt. 

## 2017-12-30 ENCOUNTER — Encounter: Payer: Self-pay | Admitting: Internal Medicine

## 2017-12-30 DIAGNOSIS — R030 Elevated blood-pressure reading, without diagnosis of hypertension: Secondary | ICD-10-CM | POA: Insufficient documentation

## 2017-12-30 NOTE — Assessment & Plan Note (Signed)
On simvastatin.  Low cholesterol diet and exercise.  Follow lipid panel and liver function tests.  Discussed recent lab results.   Lab Results  Component Value Date   CHOL 190 12/19/2017   HDL 96.10 12/19/2017   LDLCALC 83 12/19/2017   TRIG 54.0 12/19/2017   CHOLHDL 2 12/19/2017

## 2017-12-30 NOTE — Assessment & Plan Note (Signed)
Blood pressure on recheck improved.  Still elevated.  Have her spot check her pressure.  Get her back in soon to reassess.

## 2017-12-30 NOTE — Assessment & Plan Note (Signed)
Follow vitamin D level.  

## 2018-01-01 ENCOUNTER — Other Ambulatory Visit: Payer: Self-pay | Admitting: Internal Medicine

## 2018-02-10 ENCOUNTER — Other Ambulatory Visit: Payer: Self-pay | Admitting: Internal Medicine

## 2018-04-01 ENCOUNTER — Ambulatory Visit (INDEPENDENT_AMBULATORY_CARE_PROVIDER_SITE_OTHER): Payer: Federal, State, Local not specified - PPO | Admitting: Internal Medicine

## 2018-04-01 DIAGNOSIS — E7800 Pure hypercholesterolemia, unspecified: Secondary | ICD-10-CM | POA: Diagnosis not present

## 2018-04-01 DIAGNOSIS — Z0000 Encounter for general adult medical examination without abnormal findings: Secondary | ICD-10-CM | POA: Diagnosis not present

## 2018-04-01 DIAGNOSIS — E559 Vitamin D deficiency, unspecified: Secondary | ICD-10-CM | POA: Diagnosis not present

## 2018-04-01 NOTE — Assessment & Plan Note (Signed)
Physical today 04/01/18.  Colonoscopy 03/07/17 - internal hemorrhoids.  Recommended f/u colonoscopy in 10 years.  Mammogram 11/14/17 - Birads I.

## 2018-04-01 NOTE — Progress Notes (Signed)
Patient ID: Vickie Crawford, female   DOB: 12-03-1945, 72 y.o.   MRN: [id number]   Subjective:    Patient ID: Vickie Crawford, female    DOB: 12-03-1945, 72 y.o.   MRN: [id number]  HPI  Patient here for her physical exam.  She reports she is doing relatively well.  She is staying active.  No chest pain.  No sob.  No acid reflux.  No abdominal pain.  Bowels moving.  Brings in blood pressure readings.  Blood pressures averaging 112-120s/70s.  Some pain in base of left thumb.  Desires no further w/up.  Wants to monitor.  Overall feels good.     Past Medical History:  Diagnosis Date  . Allergy   . Hypercholesterolemia   . Osteopenia   . Ovarian cyst    Hospitalized 1969 and 1974 for removal  . Thyroid nodule   . Vitamin D deficiency    Past Surgical History:  Procedure Laterality Date  . COLONOSCOPY W/ POLYPECTOMY    . COLONOSCOPY WITH PROPOFOL N/A 03/07/2017   Procedure: COLONOSCOPY WITH PROPOFOL;  Surgeon: Scot JunElliott, Robert T, MD;  Location: [facility] ENDOSCOPY;  Service: Endoscopy;  Laterality: N/A;  . OVARIAN CYST REMOVAL     1969 and 1974   Family History  Problem Relation Age of Onset  . Hypertension Mother   . Heart disease Mother   . Stroke Father   . Brain cancer Sister   . Breast cancer Neg Hx    Social History   Socioeconomic History  . Marital status: Married    Spouse name: Not on file  . Number of children: 2  . Years of education: Not on file  . Highest education level: Not on file  Occupational History  . Not on file  Social Needs  . Financial resource strain: Not on file  . Food insecurity:    Worry: Not on file    Inability: Not on file  . Transportation needs:    Medical: Not on file    Non-medical: Not on file  Tobacco Use  . Smoking status: Former Smoker    Types: Cigarettes  . Smokeless tobacco: Never Used  Substance and Sexual Activity  . Alcohol use: Yes    Alcohol/week: 1.2 oz    Types: 2 Glasses of wine per week  . Drug use: No  . Sexual activity:  Not on file  Lifestyle  . Physical activity:    Days per week: Not on file    Minutes per session: Not on file  . Stress: Not on file  Relationships  . Social connections:    Talks on phone: Not on file    Gets together: Not on file    Attends religious service: Not on file    Active member of club or organization: Not on file    Attends meetings of clubs or organizations: Not on file    Relationship status: Not on file  Other Topics Concern  . Not on file  Social History Narrative  . Not on file    Outpatient Encounter Medications as of 04/01/2018  Medication Sig  . Calcium Carbonate-Vitamin D (CALTRATE 600+D PO) Take 1 tablet by mouth 2 (two) times daily.  . cholecalciferol (VITAMIN D) 1000 UNITS tablet Take 1,000 Units by mouth daily.  Marland Kitchen. desonide (DESOWEN) 0.05 % ointment at bedtime.  . fexofenadine (ALLEGRA) 180 MG tablet Take 180 mg by mouth daily.  . fluticasone (FLONASE) 50 MCG/ACT nasal spray Place 2 sprays daily into  both nostrils.  Marland Kitchen latanoprost (XALATAN) 0.005 % ophthalmic solution INSTILL ONE DROP IN BOTH EYES AT BEDTIME.  . meloxicam (MOBIC) 7.5 MG tablet TAKE 1 TABLET (7.5 MG TOTAL) BY MOUTH DAILY AS NEEDED.  Marland Kitchen pyridOXINE (VITAMIN B-6) 100 MG tablet Take 100 mg by mouth daily.  . simvastatin (ZOCOR) 20 MG tablet TAKE 1 TABLET (20 MG TOTAL) AT BEDTIME BY MOUTH.  . timolol (TIMOPTIC) 0.5 % ophthalmic solution    No facility-administered encounter medications on file as of 04/01/2018.     Review of Systems  Constitutional: Negative for appetite change and unexpected weight change.  HENT: Negative for congestion and sinus pressure.   Eyes: Negative for pain and visual disturbance.  Respiratory: Negative for cough, chest tightness and shortness of breath.   Cardiovascular: Negative for chest pain, palpitations and leg swelling.  Gastrointestinal: Negative for abdominal pain, diarrhea, nausea and vomiting.  Genitourinary: Negative for difficulty urinating and dysuria.    Musculoskeletal: Negative for joint swelling and myalgias.       Pain - base of left thumb.   Skin: Negative for color change and rash.  Neurological: Negative for dizziness, light-headedness and headaches.  Hematological: Negative for adenopathy. Does not bruise/bleed easily.  Psychiatric/Behavioral: Negative for agitation, decreased concentration and dysphoric mood.       Objective:     Blood pressure rechecked by me:  126/82  Physical Exam  Constitutional: She is oriented to person, place, and time. She appears well-developed and well-nourished. No distress.  HENT:  Nose: Nose normal.  Mouth/Throat: Oropharynx is clear and moist.  Eyes: Right eye exhibits no discharge. Left eye exhibits no discharge. No scleral icterus.  Neck: Neck supple. No thyromegaly present.  Cardiovascular: Normal rate and regular rhythm.  Pulmonary/Chest: Breath sounds normal. No accessory muscle usage. No tachypnea. No respiratory distress. She has no decreased breath sounds. She has no wheezes. She has no rhonchi. Right breast exhibits no inverted nipple, no mass, no nipple discharge and no tenderness (no axillary adenopathy). Left breast exhibits no inverted nipple, no mass, no nipple discharge and no tenderness (no axilarry adenopathy).  Abdominal: Soft. Bowel sounds are normal. There is no tenderness.  Musculoskeletal: She exhibits no edema or tenderness.  Lymphadenopathy:    She has no cervical adenopathy.  Neurological: She is alert and oriented to person, place, and time.  Skin: No rash noted. No erythema.  Psychiatric: She has a normal mood and affect. Her behavior is normal.    BP 128/80 (BP Location: Left Arm, Patient Position: Sitting, Cuff Size: Normal)   Pulse 75   Temp 97.8 F (36.6 C) (Oral)   Ht 5\' 1"  (1.549 m)   Wt 136 lb (61.7 kg)   SpO2 96%   BMI 25.70 kg/m  Wt Readings from Last 3 Encounters:  04/01/18 136 lb (61.7 kg)  12/24/17 133 lb 9.6 oz (60.6 kg)  07/10/17 133 lb 9.6  oz (60.6 kg)     Lab Results  Component Value Date   WBC 4.8 07/03/2017   HGB 15.2 (H) 07/03/2017   HCT 45.9 07/03/2017   PLT 256.0 07/03/2017   GLUCOSE 84 12/19/2017   CHOL 190 12/19/2017   TRIG 54.0 12/19/2017   HDL 96.10 12/19/2017   LDLCALC 83 12/19/2017   ALT 25 12/19/2017   AST 28 12/19/2017   NA 139 12/19/2017   K 4.3 12/19/2017   CL 104 12/19/2017   CREATININE 0.65 12/19/2017   BUN 21 12/19/2017   CO2 28 12/19/2017  TSH 1.98 07/03/2017    Mm Digital Screening Bilateral  Result Date: 11/14/2017 CLINICAL DATA:  Screening. EXAM: DIGITAL SCREENING BILATERAL MAMMOGRAM WITH CAD COMPARISON:  Previous exam(s). ACR Breast Density Category b: There are scattered areas of fibroglandular density. FINDINGS: There are no findings suspicious for malignancy. Images were processed with CAD. IMPRESSION: No mammographic evidence of malignancy. A result letter of this screening mammogram will be mailed directly to the patient. RECOMMENDATION: Screening mammogram in one year. (Code:SM-B-01Y) BI-RADS CATEGORY  1: Negative. Electronically Signed   By: Ted Mcalpine M.D.   On: 11/14/2017 13:01       Assessment & Plan:   Problem List Items Addressed This Visit    Health care maintenance    Physical today 04/01/18.  Colonoscopy 03/07/17 - internal hemorrhoids.  Recommended f/u colonoscopy in 10 years.  Mammogram 11/14/17 - Birads I.        Pure hypercholesterolemia    On simvastatin.  Low cholesterol diet and exercise.  Follow lipid panel and liver function tests.        Relevant Orders   CBC with Differential/Platelet   Hepatic function panel   Lipid panel   Basic metabolic panel   Vitamin D deficiency    Follow vitamin D level.            Dale Cushing, MD

## 2018-04-02 ENCOUNTER — Encounter: Payer: Self-pay | Admitting: Internal Medicine

## 2018-04-02 NOTE — Assessment & Plan Note (Signed)
On simvastatin.  Low cholesterol diet and exercise.  Follow lipid panel and liver function tests.   

## 2018-04-02 NOTE — Assessment & Plan Note (Signed)
Follow vitamin D level.  

## 2018-05-14 ENCOUNTER — Other Ambulatory Visit: Payer: Self-pay | Admitting: Internal Medicine

## 2018-05-14 NOTE — Telephone Encounter (Signed)
Last OV 04/01/2018   Last refilled 02/12/2018 disp 90 with no refills   Sent to PCP to advise

## 2018-06-03 ENCOUNTER — Other Ambulatory Visit (INDEPENDENT_AMBULATORY_CARE_PROVIDER_SITE_OTHER): Payer: Federal, State, Local not specified - PPO

## 2018-06-03 DIAGNOSIS — E7800 Pure hypercholesterolemia, unspecified: Secondary | ICD-10-CM | POA: Diagnosis not present

## 2018-06-03 LAB — BASIC METABOLIC PANEL
BUN: 19 mg/dL (ref 6–23)
CO2: 30 mEq/L (ref 19–32)
Calcium: 9 mg/dL (ref 8.4–10.5)
Chloride: 106 mEq/L (ref 96–112)
Creatinine, Ser: 0.72 mg/dL (ref 0.40–1.20)
GFR: 84.14 mL/min (ref >60.00)
Glucose, Bld: 100 mg/dL — ABNORMAL HIGH (ref 70–99)
Potassium: 4.1 mEq/L (ref 3.5–5.1)
Sodium: 141 mEq/L (ref 135–145)

## 2018-06-03 LAB — CBC WITH DIFFERENTIAL/PLATELET
Basophils Absolute: 0.1 10*3/uL (ref 0.0–0.1)
Basophils Relative: 1.9 % (ref 0.0–3.0)
Eosinophils Absolute: 0.3 10*3/uL (ref 0.0–0.7)
Eosinophils Relative: 5.7 % — ABNORMAL HIGH (ref 0.0–5.0)
HCT: 43.9 % (ref 36.0–46.0)
Hemoglobin: 14.6 g/dL (ref 12.0–15.0)
LYMPHS ABS: 1.6 10*3/uL (ref 0.7–4.0)
Lymphocytes Relative: 35.2 % (ref 12.0–46.0)
MCHC: 33.4 g/dL (ref 30.0–36.0)
MCV: 91 fl (ref 78.0–100.0)
MONO ABS: 0.5 10*3/uL (ref 0.1–1.0)
Monocytes Relative: 10.4 % (ref 3.0–12.0)
NEUTROS ABS: 2.1 10*3/uL (ref 1.4–7.7)
NEUTROS PCT: 46.8 % (ref 43.0–77.0)
PLATELETS: 227 10*3/uL (ref 150.0–400.0)
RBC: 4.82 Mil/uL (ref 3.87–5.11)
RDW: 12.6 % (ref 11.5–15.5)
WBC: 4.5 10*3/uL (ref 4.0–10.5)

## 2018-06-03 LAB — HEPATIC FUNCTION PANEL
ALBUMIN: 3.9 g/dL (ref 3.5–5.2)
ALT: 23 U/L (ref 0–35)
AST: 24 U/L (ref 0–37)
Alkaline Phosphatase: 52 U/L (ref 39–117)
Bilirubin, Direct: 0.1 mg/dL (ref 0.0–0.3)
TOTAL PROTEIN: 6.4 g/dL (ref 6.0–8.3)
Total Bilirubin: 0.5 mg/dL (ref 0.2–1.2)

## 2018-06-03 LAB — LIPID PANEL
Cholesterol: 184 mg/dL (ref 0–200)
HDL: 86.5 mg/dL (ref >39.00)
LDL Cholesterol: 85 mg/dL (ref 0–99)
NonHDL: 97.13
Total CHOL/HDL Ratio: 2
Triglycerides: 63 mg/dL (ref 0.0–149.0)
VLDL: 12.6 mg/dL (ref 0.0–40.0)

## 2018-06-24 ENCOUNTER — Other Ambulatory Visit: Payer: Self-pay | Admitting: Internal Medicine

## 2018-07-12 ENCOUNTER — Encounter: Payer: Self-pay | Admitting: Internal Medicine

## 2018-07-12 ENCOUNTER — Ambulatory Visit: Payer: Federal, State, Local not specified - PPO | Admitting: Internal Medicine

## 2018-07-12 DIAGNOSIS — E7800 Pure hypercholesterolemia, unspecified: Secondary | ICD-10-CM

## 2018-07-12 DIAGNOSIS — M8580 Other specified disorders of bone density and structure, unspecified site: Secondary | ICD-10-CM

## 2018-07-12 DIAGNOSIS — E559 Vitamin D deficiency, unspecified: Secondary | ICD-10-CM

## 2018-07-12 MED ORDER — MELOXICAM 7.5 MG PO TABS: ORAL_TABLET | ORAL | 0 refills | Status: DC

## 2018-07-12 NOTE — Progress Notes (Signed)
Patient ID: Vickie Crawford, female   DOB: 1945/11/24, 72 y.o.   MRN: [id number]   Subjective:    Patient ID: Vickie Crawford, female    DOB: 10-Aug-1946, 72 y.o.   MRN: [id number]  HPI  Patient here for a scheduled follow up.  She reports she is doing well.  Feels good.  Has been traveling.  Stays active.  No chest pain.  No sob.  No acid reflux.  No abdominal pain.  Bowels moving.  No urine change.  Overall feels good.  Takes meloxicam.  Controls joint pain.     Past Medical History:  Diagnosis Date  . Allergy   . Hypercholesterolemia   . Osteopenia   . Ovarian cyst    Hospitalized 1969 and 1974 for removal  . Thyroid nodule   . Vitamin D deficiency    Past Surgical History:  Procedure Laterality Date  . COLONOSCOPY W/ POLYPECTOMY    . COLONOSCOPY WITH PROPOFOL N/A 03/07/2017   Procedure: COLONOSCOPY WITH PROPOFOL;  Surgeon: Scot Jun, MD;  Location: [facility] ENDOSCOPY;  Service: Endoscopy;  Laterality: N/A;  . OVARIAN CYST REMOVAL     1969 and 1974   Family History  Problem Relation Age of Onset  . Hypertension Mother   . Heart disease Mother   . Stroke Father   . Brain cancer Sister   . Breast cancer Neg Hx    Social History   Socioeconomic History  . Marital status: Married    Spouse name: Not on file  . Number of children: 2  . Years of education: Not on file  . Highest education level: Not on file  Occupational History  . Not on file  Social Needs  . Financial resource strain: Not on file  . Food insecurity:    Worry: Not on file    Inability: Not on file  . Transportation needs:    Medical: Not on file    Non-medical: Not on file  Tobacco Use  . Smoking status: Former Smoker    Types: Cigarettes  . Smokeless tobacco: Never Used  Substance and Sexual Activity  . Alcohol use: Yes    Alcohol/week: 2.0 standard drinks    Types: 2 Glasses of wine per week  . Drug use: No  . Sexual activity: Not on file  Lifestyle  . Physical activity:    Days per week: Not  on file    Minutes per session: Not on file  . Stress: Not on file  Relationships  . Social connections:    Talks on phone: Not on file    Gets together: Not on file    Attends religious service: Not on file    Active member of club or organization: Not on file    Attends meetings of clubs or organizations: Not on file    Relationship status: Not on file  Other Topics Concern  . Not on file  Social History Narrative  . Not on file    Outpatient Encounter Medications as of 07/12/2018  Medication Sig  . Calcium Carbonate-Vitamin D (CALTRATE 600+D PO) Take 1 tablet by mouth 2 (two) times daily.  . cholecalciferol (VITAMIN D) 1000 UNITS tablet Take 1,000 Units by mouth daily.  Marland Kitchen desonide (DESOWEN) 0.05 % ointment at bedtime.  . fexofenadine (ALLEGRA) 180 MG tablet Take 180 mg by mouth daily.  . fluticasone (FLONASE) 50 MCG/ACT nasal spray Place 2 sprays daily into both nostrils.  Marland Kitchen latanoprost (XALATAN) 0.005 % ophthalmic solution INSTILL  ONE DROP IN BOTH EYES AT BEDTIME.  . meloxicam (MOBIC) 7.5 MG tablet Take one tablet q day prn  . pyridOXINE (VITAMIN B-6) 100 MG tablet Take 100 mg by mouth daily.  . simvastatin (ZOCOR) 20 MG tablet TAKE 1 TABLET (20 MG TOTAL) AT BEDTIME BY MOUTH.  . timolol (TIMOPTIC) 0.5 % ophthalmic solution   . [DISCONTINUED] meloxicam (MOBIC) 7.5 MG tablet TAKE 1 TABLET (7.5 MG TOTAL) BY MOUTH DAILY AS NEEDED.   No facility-administered encounter medications on file as of 07/12/2018.     Review of Systems  Constitutional: Negative for appetite change and unexpected weight change.  HENT: Negative for congestion and sinus pressure.   Respiratory: Negative for cough, chest tightness and shortness of breath.   Cardiovascular: Negative for chest pain, palpitations and leg swelling.  Gastrointestinal: Negative for abdominal pain, diarrhea, nausea and vomiting.  Genitourinary: Negative for difficulty urinating and dysuria.  Musculoskeletal: Negative for joint  swelling and myalgias.  Skin: Negative for color change and rash.  Neurological: Negative for dizziness, light-headedness and headaches.  Psychiatric/Behavioral: Negative for agitation and dysphoric mood.       Objective:    Physical Exam  Constitutional: She appears well-developed and well-nourished. No distress.  HENT:  Nose: Nose normal.  Mouth/Throat: Oropharynx is clear and moist.  Neck: Neck supple. No thyromegaly present.  Cardiovascular: Normal rate and regular rhythm.  Pulmonary/Chest: Breath sounds normal. No respiratory distress. She has no wheezes.  Abdominal: Soft. Bowel sounds are normal. There is no tenderness.  Musculoskeletal: She exhibits no edema or tenderness.  Lymphadenopathy:    She has no cervical adenopathy.  Skin: No rash noted. No erythema.  Psychiatric: She has a normal mood and affect. Her behavior is normal.    BP 128/78 (BP Location: Left Arm, Patient Position: Sitting, Cuff Size: Normal)   Pulse 72   Temp 98.1 F (36.7 C) (Oral)   Resp 18   Wt 136 lb 6.4 oz (61.9 kg)   SpO2 98%   BMI 25.77 kg/m  Wt Readings from Last 3 Encounters:  07/12/18 136 lb 6.4 oz (61.9 kg)  04/01/18 136 lb (61.7 kg)  12/24/17 133 lb 9.6 oz (60.6 kg)     Lab Results  Component Value Date   WBC 4.5 06/03/2018   HGB 14.6 06/03/2018   HCT 43.9 06/03/2018   PLT 227.0 06/03/2018   GLUCOSE 100 (H) 06/03/2018   CHOL 184 06/03/2018   TRIG 63.0 06/03/2018   HDL 86.50 06/03/2018   LDLCALC 85 06/03/2018   ALT 23 06/03/2018   AST 24 06/03/2018   NA 141 06/03/2018   K 4.1 06/03/2018   CL 106 06/03/2018   CREATININE 0.72 06/03/2018   BUN 19 06/03/2018   CO2 30 06/03/2018   TSH 1.98 07/03/2017    Mm Digital Screening Bilateral  Result Date: 11/14/2017 CLINICAL DATA:  Screening. EXAM: DIGITAL SCREENING BILATERAL MAMMOGRAM WITH CAD COMPARISON:  Previous exam(s). ACR Breast Density Category b: There are scattered areas of fibroglandular density. FINDINGS: There are  no findings suspicious for malignancy. Images were processed with CAD. IMPRESSION: No mammographic evidence of malignancy. A result letter of this screening mammogram will be mailed directly to the patient. RECOMMENDATION: Screening mammogram in one year. (Code:SM-B-01Y) BI-RADS CATEGORY  1: Negative. Electronically Signed   By: Ted Mcalpine M.D.   On: 11/14/2017 13:01       Assessment & Plan:   Problem List Items Addressed This Visit    Osteopenia    Continue  calcium, vitamin D and weight bearing exercise.        Pure hypercholesterolemia    On simvastatin.  Low cholesterol diet and exercise.  Follow lipid panel and liver function tests.        Relevant Orders   TSH   Lipid panel   Hepatic function panel   Basic metabolic panel   Vitamin D deficiency    Follow vitamin D level.        Relevant Orders   VITAMIN D 25 Hydroxy (Vit-D Deficiency, Fractures)       Dale Durhamharlene Branson Kranz, MD

## 2018-07-14 ENCOUNTER — Encounter: Payer: Self-pay | Admitting: Internal Medicine

## 2018-07-14 NOTE — Assessment & Plan Note (Signed)
On simvastatin.  Low cholesterol diet and exercise.  Follow lipid panel and liver function tests.   

## 2018-07-14 NOTE — Assessment & Plan Note (Signed)
Continue calcium, vitamin D and weight bearing exercise.  

## 2018-07-14 NOTE — Assessment & Plan Note (Signed)
Follow vitamin D level.  

## 2018-07-15 ENCOUNTER — Other Ambulatory Visit: Payer: Self-pay | Admitting: Internal Medicine

## 2018-10-09 ENCOUNTER — Other Ambulatory Visit: Payer: Self-pay | Admitting: Internal Medicine

## 2018-10-14 ENCOUNTER — Other Ambulatory Visit: Payer: Self-pay | Admitting: Internal Medicine

## 2018-10-14 DIAGNOSIS — Z1231 Encounter for screening mammogram for malignant neoplasm of breast: Secondary | ICD-10-CM

## 2018-10-25 ENCOUNTER — Other Ambulatory Visit (INDEPENDENT_AMBULATORY_CARE_PROVIDER_SITE_OTHER): Payer: Federal, State, Local not specified - PPO

## 2018-10-25 DIAGNOSIS — E559 Vitamin D deficiency, unspecified: Secondary | ICD-10-CM | POA: Diagnosis not present

## 2018-10-25 DIAGNOSIS — E7800 Pure hypercholesterolemia, unspecified: Secondary | ICD-10-CM | POA: Diagnosis not present

## 2018-10-25 LAB — LIPID PANEL
CHOLESTEROL: 191 mg/dL (ref 0–200)
HDL: 95.9 mg/dL (ref >39.00)
LDL Cholesterol: 81 mg/dL (ref 0–99)
NonHDL: 95.05
Total CHOL/HDL Ratio: 2
Triglycerides: 71 mg/dL (ref 0.0–149.0)
VLDL: 14.2 mg/dL (ref 0.0–40.0)

## 2018-10-25 LAB — BASIC METABOLIC PANEL
BUN: 24 mg/dL — AB (ref 6–23)
CALCIUM: 9.4 mg/dL (ref 8.4–10.5)
CO2: 28 mEq/L (ref 19–32)
CREATININE: 0.7 mg/dL (ref 0.40–1.20)
Chloride: 104 mEq/L (ref 96–112)
GFR: 81.7 mL/min (ref >60.00)
GLUCOSE: 86 mg/dL (ref 70–99)
Potassium: 4.4 mEq/L (ref 3.5–5.1)
Sodium: 140 mEq/L (ref 135–145)

## 2018-10-25 LAB — TSH: TSH: 1.62 u[IU]/mL (ref 0.35–4.50)

## 2018-10-25 LAB — HEPATIC FUNCTION PANEL
ALBUMIN: 4.3 g/dL (ref 3.5–5.2)
ALT: 26 U/L (ref 0–35)
AST: 27 U/L (ref 0–37)
Alkaline Phosphatase: 57 U/L (ref 39–117)
Bilirubin, Direct: 0.1 mg/dL (ref 0.0–0.3)
Total Bilirubin: 0.6 mg/dL (ref 0.2–1.2)
Total Protein: 6.8 g/dL (ref 6.0–8.3)

## 2018-10-25 LAB — VITAMIN D 25 HYDROXY (VIT D DEFICIENCY, FRACTURES): VITD: 64.86 ng/mL (ref 30.00–100.00)

## 2018-10-29 ENCOUNTER — Encounter: Payer: Self-pay | Admitting: Internal Medicine

## 2018-10-29 ENCOUNTER — Ambulatory Visit: Payer: Federal, State, Local not specified - PPO | Admitting: Internal Medicine

## 2018-10-29 DIAGNOSIS — H409 Unspecified glaucoma: Secondary | ICD-10-CM | POA: Diagnosis not present

## 2018-10-29 DIAGNOSIS — E7800 Pure hypercholesterolemia, unspecified: Secondary | ICD-10-CM

## 2018-10-29 DIAGNOSIS — E559 Vitamin D deficiency, unspecified: Secondary | ICD-10-CM

## 2018-10-29 NOTE — Assessment & Plan Note (Signed)
On simvastatin.  Low cholesterol diet and exercise.  Follow lipid panel and liver function tests.   

## 2018-10-29 NOTE — Progress Notes (Signed)
Patient ID: KOMALPREET BLOUIN, female   DOB: 1946-03-30, 73 y.o.   MRN: [id number]   Subjective:    Patient ID: JAKEIRA MCMURREY, female    DOB: [birth year], 73 y.o.   MRN: [id number]  HPI  Patient here for a scheduled follow up.  She reports she is doing well.  Feels good.  Stays active.  No chest pain.  No sob. No acid reflux.  No abdominal pain. Bowels moving.  No urine change.  Has a history of glaucoma.  Sees her eye MD regularly.  Recent increased pressure in left eye.  Drops changed.  Planning for laser surgery.  No headache or dizziness.    Past Medical History:  Diagnosis Date  . Allergy   . Hypercholesterolemia   . Osteopenia   . Ovarian cyst    Hospitalized 1969 and 1974 for removal  . Thyroid nodule   . Vitamin D deficiency    Past Surgical History:  Procedure Laterality Date  . COLONOSCOPY W/ POLYPECTOMY    . COLONOSCOPY WITH PROPOFOL N/A 03/07/2017   Procedure: COLONOSCOPY WITH PROPOFOL;  Surgeon: Scot Jun, MD;  Location: [facility] ENDOSCOPY;  Service: Endoscopy;  Laterality: N/A;  . OVARIAN CYST REMOVAL     1969 and 1974   Family History  Problem Relation Age of Onset  . Hypertension Mother   . Heart disease Mother   . Stroke Father   . Brain cancer Sister   . Breast cancer Neg Hx    Social History   Socioeconomic History  . Marital status: Married    Spouse name: Not on file  . Number of children: 2  . Years of education: Not on file  . Highest education level: Not on file  Occupational History  . Not on file  Social Needs  . Financial resource strain: Not on file  . Food insecurity:    Worry: Not on file    Inability: Not on file  . Transportation needs:    Medical: Not on file    Non-medical: Not on file  Tobacco Use  . Smoking status: Former Smoker    Types: Cigarettes  . Smokeless tobacco: Never Used  Substance and Sexual Activity  . Alcohol use: Yes    Alcohol/week: 2.0 standard drinks    Types: 2 Glasses of wine per week  . Drug use: No  .  Sexual activity: Not on file  Lifestyle  . Physical activity:    Days per week: Not on file    Minutes per session: Not on file  . Stress: Not on file  Relationships  . Social connections:    Talks on phone: Not on file    Gets together: Not on file    Attends religious service: Not on file    Active member of club or organization: Not on file    Attends meetings of clubs or organizations: Not on file    Relationship status: Not on file  Other Topics Concern  . Not on file  Social History Narrative  . Not on file    Outpatient Encounter Medications as of 10/29/2018  Medication Sig  . Calcium Carbonate-Vitamin D (CALTRATE 600+D PO) Take 1 tablet by mouth 2 (two) times daily.  . cholecalciferol (VITAMIN D) 1000 UNITS tablet Take 1,000 Units by mouth daily.  Marland Kitchen desonide (DESOWEN) 0.05 % ointment at bedtime.  . dorzolamide-timolol (COSOPT) 22.3-6.8 MG/ML ophthalmic solution   . fexofenadine (ALLEGRA) 180 MG tablet Take 180 mg by mouth daily.  Marland Kitchen  fluticasone (FLONASE) 50 MCG/ACT nasal spray PLACE 2 SPRAYS DAILY INTO BOTH NOSTRILS.  Marland Kitchen. latanoprost (XALATAN) 0.005 % ophthalmic solution INSTILL ONE DROP IN BOTH EYES AT BEDTIME.  . meloxicam (MOBIC) 7.5 MG tablet Take one tablet q day prn  . pyridOXINE (VITAMIN B-6) 100 MG tablet Take 100 mg by mouth daily.  . simvastatin (ZOCOR) 20 MG tablet TAKE 1 TABLET (20 MG TOTAL) AT BEDTIME BY MOUTH.  . [DISCONTINUED] timolol (TIMOPTIC) 0.5 % ophthalmic solution    No facility-administered encounter medications on file as of 10/29/2018.     Review of Systems  Constitutional: Negative for appetite change and unexpected weight change.  HENT: Negative for congestion and sinus pressure.   Respiratory: Negative for cough, chest tightness and shortness of breath.   Cardiovascular: Negative for chest pain, palpitations and leg swelling.  Gastrointestinal: Negative for abdominal pain, diarrhea, nausea and vomiting.  Genitourinary: Negative for difficulty  urinating and dysuria.  Musculoskeletal: Negative for joint swelling and myalgias.  Skin: Negative for color change and rash.  Neurological: Negative for dizziness, light-headedness and headaches.  Psychiatric/Behavioral: Negative for agitation and dysphoric mood.       Objective:    Physical Exam Constitutional:      General: She is not in acute distress.    Appearance: Normal appearance.  HENT:     Nose: Nose normal. No congestion.     Mouth/Throat:     Pharynx: No oropharyngeal exudate or posterior oropharyngeal erythema.  Neck:     Musculoskeletal: Neck supple. No muscular tenderness.     Thyroid: No thyromegaly.  Cardiovascular:     Rate and Rhythm: Normal rate and regular rhythm.  Pulmonary:     Effort: No respiratory distress.     Breath sounds: Normal breath sounds. No wheezing.  Abdominal:     General: Bowel sounds are normal.     Palpations: Abdomen is soft.     Tenderness: There is no abdominal tenderness.  Musculoskeletal:        General: No swelling or tenderness.  Lymphadenopathy:     Cervical: No cervical adenopathy.  Skin:    Findings: No erythema or rash.  Neurological:     Mental Status: She is alert.  Psychiatric:        Mood and Affect: Mood normal.        Behavior: Behavior normal.     BP 128/76   Pulse 67   Temp 98 F (36.7 C) (Oral)   Resp 16   Wt 136 lb 6.4 oz (61.9 kg)   SpO2 96%   BMI 25.77 kg/m  Wt Readings from Last 3 Encounters:  10/29/18 136 lb 6.4 oz (61.9 kg)  07/12/18 136 lb 6.4 oz (61.9 kg)  04/01/18 136 lb (61.7 kg)     Lab Results  Component Value Date   WBC 4.5 06/03/2018   HGB 14.6 06/03/2018   HCT 43.9 06/03/2018   PLT 227.0 06/03/2018   GLUCOSE 86 10/25/2018   CHOL 191 10/25/2018   TRIG 71.0 10/25/2018   HDL 95.90 10/25/2018   LDLCALC 81 10/25/2018   ALT 26 10/25/2018   AST 27 10/25/2018   NA 140 10/25/2018   K 4.4 10/25/2018   CL 104 10/25/2018   CREATININE 0.70 10/25/2018   BUN 24 (H) 10/25/2018    CO2 28 10/25/2018   TSH 1.62 10/25/2018    Mm Digital Screening Bilateral  Result Date: 11/14/2017 CLINICAL DATA:  Screening. EXAM: DIGITAL SCREENING BILATERAL MAMMOGRAM WITH CAD COMPARISON:  Previous  exam(s). ACR Breast Density Category b: There are scattered areas of fibroglandular density. FINDINGS: There are no findings suspicious for malignancy. Images were processed with CAD. IMPRESSION: No mammographic evidence of malignancy. A result letter of this screening mammogram will be mailed directly to the patient. RECOMMENDATION: Screening mammogram in one year. (Code:SM-B-01Y) BI-RADS CATEGORY  1: Negative. Electronically Signed   By: Ted Mcalpine M.D.   On: 11/14/2017 13:01       Assessment & Plan:   Problem List Items Addressed This Visit    Glaucoma    Followed by ophthalmology as outlined.        Relevant Medications   dorzolamide-timolol (COSOPT) 22.3-6.8 MG/ML ophthalmic solution   Pure hypercholesterolemia    On simvastatin.  Low cholesterol diet and exercise.  Follow lipid panel and liver function tests.        Relevant Orders   CBC with Differential/Platelet   Hepatic function panel   Lipid panel   Basic metabolic panel   Vitamin D deficiency    Follow vitamin D level.            Dale Reddick, MD

## 2018-10-29 NOTE — Assessment & Plan Note (Signed)
Follow vitamin D level.  

## 2018-10-29 NOTE — Assessment & Plan Note (Signed)
Followed by ophthalmology as outlined.   

## 2018-11-24 ENCOUNTER — Other Ambulatory Visit: Payer: Self-pay | Admitting: Internal Medicine

## 2019-01-03 ENCOUNTER — Other Ambulatory Visit: Payer: Self-pay | Admitting: Internal Medicine

## 2019-01-30 ENCOUNTER — Ambulatory Visit
Admission: RE | Admit: 2019-01-30 | Discharge: 2019-01-30 | Disposition: A | Discharge status: Home / Self Care | Payer: Federal, State, Local not specified - PPO | Source: Ambulatory Visit | Attending: Internal Medicine | Admitting: Internal Medicine

## 2019-01-30 ENCOUNTER — Other Ambulatory Visit: Payer: Self-pay

## 2019-01-30 DIAGNOSIS — Z1231 Encounter for screening mammogram for malignant neoplasm of breast: Secondary | ICD-10-CM | POA: Diagnosis present

## 2019-02-25 ENCOUNTER — Other Ambulatory Visit: Payer: Self-pay | Admitting: Internal Medicine

## 2019-04-29 ENCOUNTER — Other Ambulatory Visit: Payer: Self-pay

## 2019-04-29 ENCOUNTER — Other Ambulatory Visit (INDEPENDENT_AMBULATORY_CARE_PROVIDER_SITE_OTHER): Payer: Federal, State, Local not specified - PPO

## 2019-04-29 DIAGNOSIS — E7800 Pure hypercholesterolemia, unspecified: Secondary | ICD-10-CM

## 2019-04-29 LAB — CBC WITH DIFFERENTIAL/PLATELET
Basophils Absolute: 0.1 10*3/uL (ref 0.0–0.1)
Basophils Relative: 1.3 % (ref 0.0–3.0)
Eosinophils Absolute: 0.3 10*3/uL (ref 0.0–0.7)
Eosinophils Relative: 5.5 % — ABNORMAL HIGH (ref 0.0–5.0)
HCT: 44.7 % (ref 36.0–46.0)
Hemoglobin: 15 g/dL (ref 12.0–15.0)
Lymphocytes Relative: 33.1 % (ref 12.0–46.0)
Lymphs Abs: 1.7 10*3/uL (ref 0.7–4.0)
MCHC: 33.7 g/dL (ref 30.0–36.0)
MCV: 92.9 fl (ref 78.0–100.0)
Monocytes Absolute: 0.5 10*3/uL (ref 0.1–1.0)
Monocytes Relative: 9.8 % (ref 3.0–12.0)
Neutro Abs: 2.7 10*3/uL (ref 1.4–7.7)
Neutrophils Relative %: 50.3 % (ref 43.0–77.0)
Platelets: 233 10*3/uL (ref 150.0–400.0)
RBC: 4.81 Mil/uL (ref 3.87–5.11)
RDW: 12.8 % (ref 11.5–15.5)
WBC: 5.3 10*3/uL (ref 4.0–10.5)

## 2019-04-29 LAB — BASIC METABOLIC PANEL
BUN: 21 mg/dL (ref 6–23)
CO2: 28 mEq/L (ref 19–32)
Calcium: 9.1 mg/dL (ref 8.4–10.5)
Chloride: 105 mEq/L (ref 96–112)
Creatinine, Ser: 0.71 mg/dL (ref 0.40–1.20)
GFR: 80.26 mL/min (ref >60.00)
Glucose, Bld: 92 mg/dL (ref 70–99)
Potassium: 4.2 mEq/L (ref 3.5–5.1)
Sodium: 141 mEq/L (ref 135–145)

## 2019-04-29 LAB — LIPID PANEL
Cholesterol: 192 mg/dL (ref 0–200)
HDL: 92.7 mg/dL (ref >39.00)
LDL Cholesterol: 86 mg/dL (ref 0–99)
NonHDL: 99.14
Total CHOL/HDL Ratio: 2
Triglycerides: 64 mg/dL (ref 0.0–149.0)
VLDL: 12.8 mg/dL (ref 0.0–40.0)

## 2019-04-29 LAB — HEPATIC FUNCTION PANEL
ALT: 26 U/L (ref 0–35)
AST: 25 U/L (ref 0–37)
Albumin: 4.2 g/dL (ref 3.5–5.2)
Alkaline Phosphatase: 57 U/L (ref 39–117)
Bilirubin, Direct: 0.1 mg/dL (ref 0.0–0.3)
Total Bilirubin: 0.6 mg/dL (ref 0.2–1.2)
Total Protein: 6.4 g/dL (ref 6.0–8.3)

## 2019-05-02 ENCOUNTER — Ambulatory Visit (INDEPENDENT_AMBULATORY_CARE_PROVIDER_SITE_OTHER): Payer: Federal, State, Local not specified - PPO | Admitting: Internal Medicine

## 2019-05-02 ENCOUNTER — Other Ambulatory Visit: Payer: Self-pay

## 2019-05-02 ENCOUNTER — Encounter: Payer: Self-pay | Admitting: Internal Medicine

## 2019-05-02 VITALS — BP 130/90 | HR 62 | Temp 97.4°F | Resp 14 | Ht 61.0 in | Wt 136.6 lb

## 2019-05-02 DIAGNOSIS — Z0000 Encounter for general adult medical examination without abnormal findings: Secondary | ICD-10-CM | POA: Diagnosis not present

## 2019-05-02 DIAGNOSIS — E7800 Pure hypercholesterolemia, unspecified: Secondary | ICD-10-CM | POA: Diagnosis not present

## 2019-05-02 DIAGNOSIS — Z23 Encounter for immunization: Secondary | ICD-10-CM | POA: Diagnosis not present

## 2019-05-02 DIAGNOSIS — R030 Elevated blood-pressure reading, without diagnosis of hypertension: Secondary | ICD-10-CM

## 2019-05-02 NOTE — Progress Notes (Signed)
Patient ID: Vickie Crawford, female   DOB: 1945-12-24, 73 y.o.   MRN: [id number]   Subjective:    Patient ID: Vickie Crawford, female    DOB: [birth year], 73 y.o.   MRN: [id number]  HPI  Patient here for her physical exam.  States she is doing relatively well.  Staying active. Playing golf.  No chest pain.  No sob. No acid reflux.  No abdominal pain.  Bowels moving.  No urine change.  Handling stress.  Has been well.  Family doing well.  No fever.  No chest congestion or cough.     Past Medical History:  Diagnosis Date  . Allergy   . Hypercholesterolemia   . Osteopenia   . Ovarian cyst    Hospitalized 1969 and 1974 for removal  . Thyroid nodule   . Vitamin D deficiency    Past Surgical History:  Procedure Laterality Date  . COLONOSCOPY W/ POLYPECTOMY    . COLONOSCOPY WITH PROPOFOL N/A 03/07/2017   Procedure: COLONOSCOPY WITH PROPOFOL;  Surgeon: Scot Jun, MD;  Location: [facility] ENDOSCOPY;  Service: Endoscopy;  Laterality: N/A;  . OVARIAN CYST REMOVAL     1969 and 1974   Family History  Problem Relation Age of Onset  . Hypertension Mother   . Heart disease Mother   . Stroke Father   . Brain cancer Sister   . Breast cancer Neg Hx    Social History   Socioeconomic History  . Marital status: Married    Spouse name: Not on file  . Number of children: 2  . Years of education: Not on file  . Highest education level: Not on file  Occupational History  . Not on file  Social Needs  . Financial resource strain: Not on file  . Food insecurity    Worry: Not on file    Inability: Not on file  . Transportation needs    Medical: Not on file    Non-medical: Not on file  Tobacco Use  . Smoking status: Former Smoker    Types: Cigarettes  . Smokeless tobacco: Never Used  Substance and Sexual Activity  . Alcohol use: Yes    Alcohol/week: 2.0 standard drinks    Types: 2 Glasses of wine per week  . Drug use: No  . Sexual activity: Not on file  Lifestyle  . Physical activity   Days per week: Not on file    Minutes per session: Not on file  . Stress: Not on file  Relationships  . Social Musician on phone: Not on file    Gets together: Not on file    Attends religious service: Not on file    Active member of club or organization: Not on file    Attends meetings of clubs or organizations: Not on file    Relationship status: Not on file  Other Topics Concern  . Not on file  Social History Narrative  . Not on file    Outpatient Encounter Medications as of 05/02/2019  Medication Sig  . Calcium Carbonate-Vitamin D (CALTRATE 600+D PO) Take 1 tablet by mouth 2 (two) times daily.  . cholecalciferol (VITAMIN D) 1000 UNITS tablet Take 1,000 Units by mouth daily.  Marland Kitchen desonide (DESOWEN) 0.05 % ointment at bedtime.  . dorzolamide-timolol (COSOPT) 22.3-6.8 MG/ML ophthalmic solution   . fexofenadine (ALLEGRA) 180 MG tablet Take 180 mg by mouth daily.  . fluticasone (FLONASE) 50 MCG/ACT nasal spray PLACE 2 SPRAYS DAILY INTO BOTH NOSTRILS.  Marland Kitchen  latanoprost (XALATAN) 0.005 % ophthalmic solution INSTILL ONE DROP IN BOTH EYES AT BEDTIME.  . meloxicam (MOBIC) 7.5 MG tablet TAKE 1 TABLET BY MOUTH EVERY DAY AS NEEDED  . pyridOXINE (VITAMIN B-6) 100 MG tablet Take 100 mg by mouth daily.  . simvastatin (ZOCOR) 20 MG tablet TAKE 1 TABLET (20 MG TOTAL) AT BEDTIME BY MOUTH.   No facility-administered encounter medications on file as of 05/02/2019.     Review of Systems  Constitutional: Negative for appetite change and unexpected weight change.  HENT: Negative for congestion and sinus pressure.   Eyes: Negative for pain and visual disturbance.  Respiratory: Negative for cough, chest tightness and shortness of breath.   Cardiovascular: Negative for chest pain, palpitations and leg swelling.  Gastrointestinal: Negative for abdominal pain, diarrhea, nausea and vomiting.  Genitourinary: Negative for difficulty urinating and dysuria.  Musculoskeletal: Negative for joint swelling  and myalgias.  Skin: Negative for color change and rash.  Neurological: Negative for dizziness, light-headedness and headaches.  Hematological: Negative for adenopathy. Does not bruise/bleed easily.  Psychiatric/Behavioral: Negative for agitation and dysphoric mood.       Objective:    Physical Exam Constitutional:      General: She is not in acute distress.    Appearance: Normal appearance. She is well-developed.  HENT:     Right Ear: External ear normal.     Left Ear: External ear normal.  Eyes:     General: No scleral icterus.       Right eye: No discharge.        Left eye: No discharge.     Conjunctiva/sclera: Conjunctivae normal.  Neck:     Musculoskeletal: Neck supple. No muscular tenderness.     Thyroid: No thyromegaly.  Cardiovascular:     Rate and Rhythm: Normal rate and regular rhythm.  Pulmonary:     Effort: No tachypnea, accessory muscle usage or respiratory distress.     Breath sounds: Normal breath sounds. No decreased breath sounds or wheezing.  Chest:     Breasts:        Right: No inverted nipple, mass, nipple discharge or tenderness (no axillary adenopathy).        Left: No inverted nipple, mass, nipple discharge or tenderness (no axilarry adenopathy).  Abdominal:     General: Bowel sounds are normal.     Palpations: Abdomen is soft.     Tenderness: There is no abdominal tenderness.  Musculoskeletal:        General: No swelling or tenderness.  Lymphadenopathy:     Cervical: No cervical adenopathy.  Skin:    Findings: No erythema or rash.  Neurological:     Mental Status: She is alert and oriented to person, place, and time.  Psychiatric:        Mood and Affect: Mood normal.        Behavior: Behavior normal.     BP 130/90 (BP Location: Left Arm, Patient Position: Sitting, Cuff Size: Normal)   Pulse 62   Temp (!) 97.4 F (36.3 C) (Temporal)   Resp 14   Ht 5\' 1"  (1.549 m)   Wt 136 lb 9.6 oz (62 kg)   SpO2 95%   BMI 25.81 kg/m  Wt Readings  from Last 3 Encounters:  05/02/19 136 lb 9.6 oz (62 kg)  10/29/18 136 lb 6.4 oz (61.9 kg)  07/12/18 136 lb 6.4 oz (61.9 kg)     Lab Results  Component Value Date   WBC 5.3 04/29/2019  HGB 15.0 04/29/2019   HCT 44.7 04/29/2019   PLT 233.0 04/29/2019   GLUCOSE 92 04/29/2019   CHOL 192 04/29/2019   TRIG 64.0 04/29/2019   HDL 92.70 04/29/2019   LDLCALC 86 04/29/2019   ALT 26 04/29/2019   AST 25 04/29/2019   NA 141 04/29/2019   K 4.2 04/29/2019   CL 105 04/29/2019   CREATININE 0.71 04/29/2019   BUN 21 04/29/2019   CO2 28 04/29/2019   TSH 1.62 10/25/2018    Mm 3d Screen Breast Bilateral  Result Date: 01/30/2019 CLINICAL DATA:  Screening. EXAM: DIGITAL SCREENING BILATERAL MAMMOGRAM WITH TOMO AND CAD COMPARISON:  Previous exam(s). ACR Breast Density Category b: There are scattered areas of fibroglandular density. FINDINGS: There are no findings suspicious for malignancy. Images were processed with CAD. IMPRESSION: No mammographic evidence of malignancy. A result letter of this screening mammogram will be mailed directly to the patient. RECOMMENDATION: Screening mammogram in one year. (Code:SM-B-01Y) BI-RADS CATEGORY  1: Negative. Electronically Signed   By: Lajean Manes M.D.   On: 01/30/2019 15:15       Assessment & Plan:   Problem List Items Addressed This Visit    Elevated blood pressure reading    Elevated today. She reports blood pressure better on outside checks.  Spot check pressure.  Follow pressures.  Follow metabolic panel.       Health care maintenance    Physical today 05/02/19.  Colonoscopy 03/07/17 - internal hemorrhoids.  Recommended f/u 10 years.  Mammogram 01/30/19 - Birads I.  Schedule bone density.        Pure hypercholesterolemia    On simvastatin.  Low cholesterol diet and exercise.  Follow lipid panel and liver function tests.         Other Visit Diagnoses    Routine general medical examination at a health care facility    -  Primary   Need for  immunization against influenza       Relevant Orders   Flu Vaccine QUAD High Dose(Fluad) (Completed)       Einar Pheasant, MD

## 2019-05-05 ENCOUNTER — Encounter: Payer: Self-pay | Admitting: Internal Medicine

## 2019-05-05 NOTE — Assessment & Plan Note (Signed)
On simvastatin.  Low cholesterol diet and exercise.  Follow lipid panel and liver function tests.   

## 2019-05-05 NOTE — Assessment & Plan Note (Signed)
Elevated today. She reports blood pressure better on outside checks.  Spot check pressure.  Follow pressures.  Follow metabolic panel.

## 2019-05-05 NOTE — Assessment & Plan Note (Addendum)
Physical today 05/02/19.  Colonoscopy 03/07/17 - internal hemorrhoids.  Recommended f/u 10 years.  Mammogram 01/30/19 - Birads I.  Schedule bone density.

## 2019-07-05 ENCOUNTER — Other Ambulatory Visit: Payer: Self-pay | Admitting: Internal Medicine

## 2019-08-07 ENCOUNTER — Ambulatory Visit: Payer: Federal, State, Local not specified - PPO | Admitting: Internal Medicine

## 2019-08-14 ENCOUNTER — Other Ambulatory Visit: Payer: Self-pay | Admitting: Internal Medicine

## 2019-09-09 ENCOUNTER — Other Ambulatory Visit: Payer: Self-pay | Admitting: Internal Medicine

## 2019-10-27 ENCOUNTER — Telehealth: Payer: Self-pay | Admitting: Internal Medicine

## 2019-10-27 NOTE — Telephone Encounter (Signed)
Pt is requesting labs the week before her 3/16 appt. No orders in chart.

## 2019-10-29 ENCOUNTER — Telehealth: Payer: Self-pay

## 2019-10-29 DIAGNOSIS — E7800 Pure hypercholesterolemia, unspecified: Secondary | ICD-10-CM

## 2019-10-29 DIAGNOSIS — R030 Elevated blood-pressure reading, without diagnosis of hypertension: Secondary | ICD-10-CM

## 2019-10-29 NOTE — Telephone Encounter (Signed)
See other phone note with labs attached.

## 2019-10-29 NOTE — Telephone Encounter (Signed)
Labs ordered. Please schedule fasting labs prior to appt.

## 2019-10-29 NOTE — Telephone Encounter (Signed)
Called and scheduled pt for labs on 11/06/19

## 2019-11-06 ENCOUNTER — Other Ambulatory Visit (INDEPENDENT_AMBULATORY_CARE_PROVIDER_SITE_OTHER): Payer: Federal, State, Local not specified - PPO

## 2019-11-06 ENCOUNTER — Other Ambulatory Visit: Payer: Self-pay

## 2019-11-06 DIAGNOSIS — E7800 Pure hypercholesterolemia, unspecified: Secondary | ICD-10-CM | POA: Diagnosis not present

## 2019-11-06 LAB — LIPID PANEL
Cholesterol: 186 mg/dL (ref 0–200)
HDL: 92 mg/dL (ref >39.00)
LDL Cholesterol: 83 mg/dL (ref 0–99)
NonHDL: 93.9
Total CHOL/HDL Ratio: 2
Triglycerides: 53 mg/dL (ref 0.0–149.0)
VLDL: 10.6 mg/dL (ref 0.0–40.0)

## 2019-11-06 LAB — BASIC METABOLIC PANEL
BUN: 22 mg/dL (ref 6–23)
CO2: 29 mEq/L (ref 19–32)
Calcium: 9 mg/dL (ref 8.4–10.5)
Chloride: 106 mEq/L (ref 96–112)
Creatinine, Ser: 0.73 mg/dL (ref 0.40–1.20)
GFR: 77.62 mL/min (ref >60.00)
Glucose, Bld: 93 mg/dL (ref 70–99)
Potassium: 4 mEq/L (ref 3.5–5.1)
Sodium: 141 mEq/L (ref 135–145)

## 2019-11-06 LAB — HEPATIC FUNCTION PANEL
ALT: 23 U/L (ref 0–35)
AST: 28 U/L (ref 0–37)
Albumin: 4 g/dL (ref 3.5–5.2)
Alkaline Phosphatase: 59 U/L (ref 39–117)
Bilirubin, Direct: 0.1 mg/dL (ref 0.0–0.3)
Total Bilirubin: 0.6 mg/dL (ref 0.2–1.2)
Total Protein: 6.6 g/dL (ref 6.0–8.3)

## 2019-11-06 LAB — CBC WITH DIFFERENTIAL/PLATELET
Basophils Absolute: 0.1 10*3/uL (ref 0.0–0.1)
Basophils Relative: 2.1 % (ref 0.0–3.0)
Eosinophils Absolute: 0.2 10*3/uL (ref 0.0–0.7)
Eosinophils Relative: 5.2 % — ABNORMAL HIGH (ref 0.0–5.0)
HCT: 44.5 % (ref 36.0–46.0)
Hemoglobin: 14.7 g/dL (ref 12.0–15.0)
Lymphocytes Relative: 31.6 % (ref 12.0–46.0)
Lymphs Abs: 1.4 10*3/uL (ref 0.7–4.0)
MCHC: 33 g/dL (ref 30.0–36.0)
MCV: 92.9 fl (ref 78.0–100.0)
Monocytes Absolute: 0.4 10*3/uL (ref 0.1–1.0)
Monocytes Relative: 9.5 % (ref 3.0–12.0)
Neutro Abs: 2.3 10*3/uL (ref 1.4–7.7)
Neutrophils Relative %: 51.6 % (ref 43.0–77.0)
Platelets: 233 10*3/uL (ref 150.0–400.0)
RBC: 4.79 Mil/uL (ref 3.87–5.11)
RDW: 12.6 % (ref 11.5–15.5)
WBC: 4.5 10*3/uL (ref 4.0–10.5)

## 2019-11-06 LAB — TSH: TSH: 1.61 u[IU]/mL (ref 0.35–4.50)

## 2019-11-11 ENCOUNTER — Other Ambulatory Visit: Payer: Self-pay

## 2019-11-11 ENCOUNTER — Encounter: Payer: Self-pay | Admitting: Internal Medicine

## 2019-11-11 ENCOUNTER — Ambulatory Visit: Payer: Federal, State, Local not specified - PPO | Admitting: Internal Medicine

## 2019-11-11 DIAGNOSIS — E7800 Pure hypercholesterolemia, unspecified: Secondary | ICD-10-CM

## 2019-11-11 DIAGNOSIS — R030 Elevated blood-pressure reading, without diagnosis of hypertension: Secondary | ICD-10-CM | POA: Diagnosis not present

## 2019-11-11 DIAGNOSIS — E559 Vitamin D deficiency, unspecified: Secondary | ICD-10-CM | POA: Diagnosis not present

## 2019-11-11 NOTE — Progress Notes (Addendum)
Patient ID: Vickie Crawford, female   DOB: 19-Apr-1946, 74 y.o.   MRN: [id number]   Subjective:    Patient ID: Vickie Crawford, female    DOB: 1946-04-22, 74 y.o.   MRN: [id number]  HPI This visit occurred during the SARS-CoV-2 public health emergency.  Safety protocols were in place, including screening questions prior to the visit, additional usage of staff PPE, and extensive cleaning of exam room while observing appropriate contact time as indicated for disinfecting solutions.  Patient here for a scheduled follow up. She is doing well.  Staying active.  Back exercising at her fitness center.  Walking.  No chest pain or sob.  No acid reflux.  No abdominal pain.  Bowels moving.  No urine change.  Handling stress.  Joints stable.    Past Medical History:  Diagnosis Date  . Allergy   . Hypercholesterolemia   . Osteopenia   . Ovarian cyst    Hospitalized 1969 and 1974 for removal  . Thyroid nodule   . Vitamin D deficiency    Past Surgical History:  Procedure Laterality Date  . COLONOSCOPY W/ POLYPECTOMY    . COLONOSCOPY WITH PROPOFOL N/A 03/07/2017   Procedure: COLONOSCOPY WITH PROPOFOL;  Surgeon: Scot Jun, MD;  Location: [facility] ENDOSCOPY;  Service: Endoscopy;  Laterality: N/A;  . OVARIAN CYST REMOVAL     1969 and 1974   Family History  Problem Relation Age of Onset  . Hypertension Mother   . Heart disease Mother   . Stroke Father   . Brain cancer Sister   . Breast cancer Neg Hx    Social History   Socioeconomic History  . Marital status: Married    Spouse name: Not on file  . Number of children: 2  . Years of education: Not on file  . Highest education level: Not on file  Occupational History  . Not on file  Tobacco Use  . Smoking status: Former Smoker    Types: Cigarettes  . Smokeless tobacco: Never Used  Substance and Sexual Activity  . Alcohol use: Yes    Alcohol/week: 2.0 standard drinks    Types: 2 Glasses of wine per week  . Drug use: No  . Sexual activity: Not  on file  Other Topics Concern  . Not on file  Social History Narrative  . Not on file   Social Determinants of Health   Financial Resource Strain:   . Difficulty of Paying Living Expenses:   Food Insecurity:   . Worried About Programme researcher, broadcasting/film/video in the Last Year:   . Barista in the Last Year:   Transportation Needs:   . Freight forwarder (Medical):   Marland Kitchen Lack of Transportation (Non-Medical):   Physical Activity:   . Days of Exercise per Week:   . Minutes of Exercise per Session:   Stress:   . Feeling of Stress :   Social Connections:   . Frequency of Communication with Friends and Family:   . Frequency of Social Gatherings with Friends and Family:   . Attends Religious Services:   . Active Member of Clubs or Organizations:   . Attends Banker Meetings:   Marland Kitchen Marital Status:     Outpatient Encounter Medications as of 11/11/2019  Medication Sig  . Calcium Carbonate-Vitamin D (CALTRATE 600+D PO) Take 1 tablet by mouth 2 (two) times daily.  . cholecalciferol (VITAMIN D) 1000 UNITS tablet Take 1,000 Units by mouth daily.  Marland Kitchen desonide (  DESOWEN) 0.05 % ointment at bedtime.  . dorzolamide-timolol (COSOPT) 22.3-6.8 MG/ML ophthalmic solution   . fexofenadine (ALLEGRA) 180 MG tablet Take 180 mg by mouth daily.  . fluticasone (FLONASE) 50 MCG/ACT nasal spray PLACE 2 SPRAYS DAILY INTO BOTH NOSTRILS.  Marland Kitchen latanoprost (XALATAN) 0.005 % ophthalmic solution INSTILL ONE DROP IN BOTH EYES AT BEDTIME.  . meloxicam (MOBIC) 7.5 MG tablet TAKE 1 TABLET BY MOUTH EVERY DAY AS NEEDED  . pyridOXINE (VITAMIN B-6) 100 MG tablet Take 100 mg by mouth daily.  . simvastatin (ZOCOR) 20 MG tablet TAKE 1 TABLET (20 MG TOTAL) AT BEDTIME BY MOUTH.   No facility-administered encounter medications on file as of 11/11/2019.    Review of Systems  Constitutional: Negative for appetite change and unexpected weight change.  HENT: Negative for congestion and sinus pressure.   Respiratory:  Negative for cough, chest tightness and shortness of breath.   Cardiovascular: Negative for chest pain, palpitations and leg swelling.  Gastrointestinal: Negative for abdominal pain, diarrhea, nausea and vomiting.  Genitourinary: Negative for difficulty urinating and dysuria.  Musculoskeletal: Negative for joint swelling and myalgias.  Skin: Negative for color change and rash.  Neurological: Negative for dizziness, light-headedness and headaches.  Psychiatric/Behavioral: Negative for agitation and dysphoric mood.       Objective:    Physical Exam Constitutional:      General: She is not in acute distress.    Appearance: Normal appearance.  HENT:     Head: Normocephalic and atraumatic.     Right Ear: External ear normal.     Left Ear: External ear normal.  Eyes:     General: No scleral icterus.       Right eye: No discharge.        Left eye: No discharge.     Conjunctiva/sclera: Conjunctivae normal.  Neck:     Thyroid: No thyromegaly.  Cardiovascular:     Rate and Rhythm: Normal rate and regular rhythm.  Pulmonary:     Effort: No respiratory distress.     Breath sounds: Normal breath sounds. No wheezing.  Abdominal:     General: Bowel sounds are normal.     Palpations: Abdomen is soft.     Tenderness: There is no abdominal tenderness.  Musculoskeletal:        General: No swelling or tenderness.     Cervical back: Neck supple. No tenderness.  Lymphadenopathy:     Cervical: No cervical adenopathy.  Skin:    Findings: No erythema or rash.  Neurological:     Mental Status: She is alert.  Psychiatric:        Mood and Affect: Mood normal.        Behavior: Behavior normal.     BP 118/72   Pulse 68   Temp (!) 96 F (35.6 C)   Resp 16   Ht 5\' 1"  (1.549 m)   Wt 135 lb 6.4 oz (61.4 kg)   SpO2 98%   BMI 25.58 kg/m  Wt Readings from Last 3 Encounters:  11/11/19 135 lb 6.4 oz (61.4 kg)  05/02/19 136 lb 9.6 oz (62 kg)  10/29/18 136 lb 6.4 oz (61.9 kg)     Lab  Results  Component Value Date   WBC 4.5 11/06/2019   HGB 14.7 11/06/2019   HCT 44.5 11/06/2019   PLT 233.0 11/06/2019   GLUCOSE 93 11/06/2019   CHOL 186 11/06/2019   TRIG 53.0 11/06/2019   HDL 92.00 11/06/2019   LDLCALC 83 11/06/2019  ALT 23 11/06/2019   AST 28 11/06/2019   NA 141 11/06/2019   K 4.0 11/06/2019   CL 106 11/06/2019   CREATININE 0.73 11/06/2019   BUN 22 11/06/2019   CO2 29 11/06/2019   TSH 1.61 11/06/2019    MM 3D SCREEN BREAST BILATERAL  Result Date: 01/30/2019 CLINICAL DATA:  Screening. EXAM: DIGITAL SCREENING BILATERAL MAMMOGRAM WITH TOMO AND CAD COMPARISON:  Previous exam(s). ACR Breast Density Category b: There are scattered areas of fibroglandular density. FINDINGS: There are no findings suspicious for malignancy. Images were processed with CAD. IMPRESSION: No mammographic evidence of malignancy. A result letter of this screening mammogram will be mailed directly to the patient. RECOMMENDATION: Screening mammogram in one year. (Code:SM-B-01Y) BI-RADS CATEGORY  1: Negative. Electronically Signed   By: Amie Portland M.D.   On: 01/30/2019 15:15       Assessment & Plan:   Problem List Items Addressed This Visit    Elevated blood pressure reading    Blood pressure on outside checks - 110-120s/70s.  She brought her blood pressure cuff.  Correlates.  Follow.  On no medication.        Pure hypercholesterolemia    On simvastatin.  Low cholesterol diet and exercise.  Follow lipid panel and liver function tests.        Relevant Orders   Hepatic function panel   Lipid panel   Basic metabolic panel   Vitamin D deficiency    Follow vitamin D level.            Dale [place], MD

## 2019-11-16 ENCOUNTER — Encounter: Payer: Self-pay | Admitting: Internal Medicine

## 2019-11-16 NOTE — Assessment & Plan Note (Signed)
Follow vitamin D level.  

## 2019-11-16 NOTE — Assessment & Plan Note (Signed)
Blood pressure on outside checks - 110-120s/70s.  She brought her blood pressure cuff.  Correlates.  Follow.  On no medication.

## 2019-11-16 NOTE — Assessment & Plan Note (Signed)
On simvastatin.  Low cholesterol diet and exercise.  Follow lipid panel and liver function tests.   

## 2020-01-16 ENCOUNTER — Other Ambulatory Visit: Payer: Self-pay | Admitting: Internal Medicine

## 2020-02-04 ENCOUNTER — Other Ambulatory Visit: Payer: Self-pay | Admitting: Internal Medicine

## 2020-02-04 DIAGNOSIS — Z1231 Encounter for screening mammogram for malignant neoplasm of breast: Secondary | ICD-10-CM

## 2020-02-11 ENCOUNTER — Ambulatory Visit
Admission: RE | Admit: 2020-02-11 | Discharge: 2020-02-11 | Disposition: A | Discharge status: Home / Self Care | Payer: Federal, State, Local not specified - PPO | Source: Ambulatory Visit | Attending: Internal Medicine | Admitting: Internal Medicine

## 2020-02-11 DIAGNOSIS — Z1231 Encounter for screening mammogram for malignant neoplasm of breast: Secondary | ICD-10-CM | POA: Diagnosis present

## 2020-02-12 ENCOUNTER — Other Ambulatory Visit: Payer: Self-pay | Admitting: Internal Medicine

## 2020-05-10 ENCOUNTER — Other Ambulatory Visit: Payer: Self-pay

## 2020-05-10 ENCOUNTER — Other Ambulatory Visit (INDEPENDENT_AMBULATORY_CARE_PROVIDER_SITE_OTHER): Payer: Federal, State, Local not specified - PPO

## 2020-05-10 DIAGNOSIS — E7800 Pure hypercholesterolemia, unspecified: Secondary | ICD-10-CM

## 2020-05-10 LAB — LIPID PANEL
Cholesterol: 187 mg/dL (ref 0–200)
HDL: 91.1 mg/dL (ref >39.00)
LDL Cholesterol: 81 mg/dL (ref 0–99)
NonHDL: 96.06
Total CHOL/HDL Ratio: 2
Triglycerides: 73 mg/dL (ref 0.0–149.0)
VLDL: 14.6 mg/dL (ref 0.0–40.0)

## 2020-05-10 LAB — BASIC METABOLIC PANEL
BUN: 22 mg/dL (ref 6–23)
CO2: 29 mEq/L (ref 19–32)
Calcium: 8.8 mg/dL (ref 8.4–10.5)
Chloride: 106 mEq/L (ref 96–112)
Creatinine, Ser: 0.73 mg/dL (ref 0.40–1.20)
GFR: 77.51 mL/min (ref >60.00)
Glucose, Bld: 93 mg/dL (ref 70–99)
Potassium: 4.3 mEq/L (ref 3.5–5.1)
Sodium: 142 mEq/L (ref 135–145)

## 2020-05-10 LAB — HEPATIC FUNCTION PANEL
ALT: 23 U/L (ref 0–35)
AST: 24 U/L (ref 0–37)
Albumin: 4.2 g/dL (ref 3.5–5.2)
Alkaline Phosphatase: 54 U/L (ref 39–117)
Bilirubin, Direct: 0.1 mg/dL (ref 0.0–0.3)
Total Bilirubin: 0.7 mg/dL (ref 0.2–1.2)
Total Protein: 6.1 g/dL (ref 6.0–8.3)

## 2020-05-13 ENCOUNTER — Other Ambulatory Visit: Payer: Federal, State, Local not specified - PPO

## 2020-05-17 ENCOUNTER — Other Ambulatory Visit: Payer: Self-pay

## 2020-05-17 ENCOUNTER — Encounter: Payer: Self-pay | Admitting: Internal Medicine

## 2020-05-17 ENCOUNTER — Ambulatory Visit (INDEPENDENT_AMBULATORY_CARE_PROVIDER_SITE_OTHER): Payer: Federal, State, Local not specified - PPO | Admitting: Internal Medicine

## 2020-05-17 VITALS — BP 132/78 | HR 76 | Temp 98.0°F | Resp 16 | Ht 61.0 in | Wt 134.0 lb

## 2020-05-17 DIAGNOSIS — Z0000 Encounter for general adult medical examination without abnormal findings: Secondary | ICD-10-CM

## 2020-05-17 DIAGNOSIS — E559 Vitamin D deficiency, unspecified: Secondary | ICD-10-CM

## 2020-05-17 DIAGNOSIS — E7800 Pure hypercholesterolemia, unspecified: Secondary | ICD-10-CM | POA: Diagnosis not present

## 2020-05-17 DIAGNOSIS — Z23 Encounter for immunization: Secondary | ICD-10-CM

## 2020-05-17 NOTE — Assessment & Plan Note (Addendum)
Physical today 05/17/20.  Colonoscopy 02/2017 - internal hemorrhoids.  recomnended f/u in 10 years.  Mammogram 02/13/20 - Birads I.

## 2020-05-17 NOTE — Progress Notes (Signed)
Patient ID: ANYAE GRIFFITH, female   DOB: 08-03-1946, 74 y.o.   MRN: [id number]   Subjective:    Patient ID: RAMA SORCI, female    DOB: [birth year], 74 y.o.   MRN: [id number]  HPI This visit occurred during the SARS-CoV-2 public health emergency.  Safety protocols were in place, including screening questions prior to the visit, additional usage of staff PPE, and extensive cleaning of exam room while observing appropriate contact time as indicated for disinfecting solutions.  Patient here for her physical exam.  She is doing well.  Feels good.  Staying active. Playing golf.  Going to the mountains.  No chest pain or sob.  No cardiac symptoms with increased activity or exertion.  No acid reflux.  No abdominal pain or bowel change reported.  Planning for left cataract eye surgery.  Blood pressures doing well - reviewed outside readings- averaging 110-120/70s.     Past Medical History:  Diagnosis Date  . Allergy   . Hypercholesterolemia   . Osteopenia   . Ovarian cyst    Hospitalized 1969 and 1974 for removal  . Thyroid nodule   . Vitamin D deficiency    Past Surgical History:  Procedure Laterality Date  . COLONOSCOPY W/ POLYPECTOMY    . COLONOSCOPY WITH PROPOFOL N/A 03/07/2017   Procedure: COLONOSCOPY WITH PROPOFOL;  Surgeon: Scot Jun, MD;  Location: [facility] ENDOSCOPY;  Service: Endoscopy;  Laterality: N/A;  . OVARIAN CYST REMOVAL     1969 and 1974   Family History  Problem Relation Age of Onset  . Hypertension Mother   . Heart disease Mother   . Stroke Father   . Brain cancer Sister   . Breast cancer Neg Hx    Social History   Socioeconomic History  . Marital status: Married    Spouse name: Not on file  . Number of children: 2  . Years of education: Not on file  . Highest education level: Not on file  Occupational History  . Not on file  Tobacco Use  . Smoking status: Former Smoker    Types: Cigarettes  . Smokeless tobacco: Never Used  Vaping Use  . Vaping Use: Never  used  Substance and Sexual Activity  . Alcohol use: Yes    Alcohol/week: 2.0 standard drinks    Types: 2 Glasses of wine per week  . Drug use: No  . Sexual activity: Not on file  Other Topics Concern  . Not on file  Social History Narrative  . Not on file   Social Determinants of Health   Financial Resource Strain:   . Difficulty of Paying Living Expenses: Not on file  Food Insecurity:   . Worried About Programme researcher, broadcasting/film/video in the Last Year: Not on file  . Ran Out of Food in the Last Year: Not on file  Transportation Needs:   . Lack of Transportation (Medical): Not on file  . Lack of Transportation (Non-Medical): Not on file  Physical Activity:   . Days of Exercise per Week: Not on file  . Minutes of Exercise per Session: Not on file  Stress:   . Feeling of Stress : Not on file  Social Connections:   . Frequency of Communication with Friends and Family: Not on file  . Frequency of Social Gatherings with Friends and Family: Not on file  . Attends Religious Services: Not on file  . Active Member of Clubs or Organizations: Not on file  . Attends Banker  Meetings: Not on file  . Marital Status: Not on file    Outpatient Encounter Medications as of 05/17/2020  Medication Sig  . Calcium Carbonate-Vitamin D (CALTRATE 600+D PO) Take 1 tablet by mouth 2 (two) times daily.  . cholecalciferol (VITAMIN D) 1000 UNITS tablet Take 1,000 Units by mouth daily.  Marland Kitchen desonide (DESOWEN) 0.05 % ointment at bedtime.  . dorzolamide-timolol (COSOPT) 22.3-6.8 MG/ML ophthalmic solution   . fexofenadine (ALLEGRA) 180 MG tablet Take 180 mg by mouth daily.  . fluticasone (FLONASE) 50 MCG/ACT nasal spray PLACE 2 SPRAYS DAILY INTO BOTH NOSTRILS.  Marland Kitchen latanoprost (XALATAN) 0.005 % ophthalmic solution INSTILL ONE DROP IN BOTH EYES AT BEDTIME.  . meloxicam (MOBIC) 7.5 MG tablet TAKE 1 TABLET BY MOUTH EVERY DAY AS NEEDED  . pyridOXINE (VITAMIN B-6) 100 MG tablet Take 100 mg by mouth daily.  .  simvastatin (ZOCOR) 20 MG tablet TAKE 1 TABLET (20 MG TOTAL) AT BEDTIME BY MOUTH.   No facility-administered encounter medications on file as of 05/17/2020.    Review of Systems  Constitutional: Negative for appetite change and unexpected weight change.  HENT: Negative for congestion and sinus pressure.   Eyes: Negative for pain and visual disturbance.  Respiratory: Negative for cough, chest tightness and shortness of breath.   Cardiovascular: Negative for chest pain, palpitations and leg swelling.  Gastrointestinal: Negative for abdominal pain, diarrhea, nausea and vomiting.  Genitourinary: Negative for difficulty urinating and dysuria.  Musculoskeletal: Negative for joint swelling and myalgias.  Skin: Negative for color change and rash.  Neurological: Negative for dizziness, light-headedness and headaches.  Hematological: Negative for adenopathy. Does not bruise/bleed easily.  Psychiatric/Behavioral: Negative for agitation and dysphoric mood.       Objective:    Physical Exam Vitals reviewed.  Constitutional:      General: She is not in acute distress.    Appearance: Normal appearance. She is well-developed.  HENT:     Head: Normocephalic and atraumatic.     Right Ear: External ear normal.     Left Ear: External ear normal.  Eyes:     General: No scleral icterus.       Right eye: No discharge.        Left eye: No discharge.     Conjunctiva/sclera: Conjunctivae normal.  Neck:     Thyroid: No thyromegaly.  Cardiovascular:     Rate and Rhythm: Normal rate and regular rhythm.  Pulmonary:     Effort: No tachypnea, accessory muscle usage or respiratory distress.     Breath sounds: Normal breath sounds. No decreased breath sounds or wheezing.  Chest:     Breasts:        Right: No inverted nipple, mass, nipple discharge or tenderness (no axillary adenopathy).        Left: No inverted nipple, mass, nipple discharge or tenderness (no axilarry adenopathy).  Abdominal:      General: Bowel sounds are normal.     Palpations: Abdomen is soft.     Tenderness: There is no abdominal tenderness.  Musculoskeletal:        General: No swelling or tenderness.     Cervical back: Neck supple. No tenderness.  Lymphadenopathy:     Cervical: No cervical adenopathy.  Skin:    General: Skin is warm.     Findings: No erythema or rash.  Neurological:     Mental Status: She is alert and oriented to person, place, and time.  Psychiatric:        Mood  and Affect: Mood normal.        Behavior: Behavior normal.     BP 132/78   Pulse 76   Temp 98 F (36.7 C) (Oral)   Resp 16   Ht 5\' 1"  (1.549 m)   Wt 134 lb (60.8 kg)   SpO2 98%   BMI 25.32 kg/m  Wt Readings from Last 3 Encounters:  05/17/20 134 lb (60.8 kg)  11/11/19 135 lb 6.4 oz (61.4 kg)  05/02/19 136 lb 9.6 oz (62 kg)     Lab Results  Component Value Date   WBC 4.5 11/06/2019   HGB 14.7 11/06/2019   HCT 44.5 11/06/2019   PLT 233.0 11/06/2019   GLUCOSE 93 05/10/2020   CHOL 187 05/10/2020   TRIG 73.0 05/10/2020   HDL 91.10 05/10/2020   LDLCALC 81 05/10/2020   ALT 23 05/10/2020   AST 24 05/10/2020   NA 142 05/10/2020   K 4.3 05/10/2020   CL 106 05/10/2020   CREATININE 0.73 05/10/2020   BUN 22 05/10/2020   CO2 29 05/10/2020   TSH 1.61 11/06/2019    MM 3D SCREEN BREAST BILATERAL  Result Date: 02/13/2020 CLINICAL DATA:  Screening. EXAM: DIGITAL SCREENING BILATERAL MAMMOGRAM WITH TOMO AND CAD COMPARISON:  Previous exam(s). ACR Breast Density Category a: The breast tissue is almost entirely fatty. FINDINGS: There are no findings suspicious for malignancy. Images were processed with CAD. IMPRESSION: No mammographic evidence of malignancy. A result letter of this screening mammogram will be mailed directly to the patient. RECOMMENDATION: Screening mammogram in one year. (Code:[id number]-B-01Y) BI-RADS CATEGORY  1: Negative. Electronically Signed   By: 02/15/2020 M.D.   On: 02/13/2020 09:14       Assessment  & Plan:   Problem List Items Addressed This Visit    Vitamin D deficiency    Follow vitamin D level.       Pure hypercholesterolemia    On simvastatin.  Low cholesterol diet and exercise.  Follow lipid panel and liver function tests.        Relevant Orders   CBC with Differential/Platelet   Hepatic function panel   Lipid panel   TSH   Basic metabolic panel   T4, free   Health care maintenance    Physical today 05/17/20.  Colonoscopy 02/2017 - internal hemorrhoids.  recomnended f/u in 10 years.  Mammogram 02/13/20 - Birads I.         Other Visit Diagnoses    Need for immunization against influenza    -  Primary   Relevant Orders   Flu Vaccine QUAD High Dose(Fluad) (Completed)       02/15/20, MD

## 2020-05-23 ENCOUNTER — Encounter: Payer: Self-pay | Admitting: Internal Medicine

## 2020-05-23 NOTE — Assessment & Plan Note (Signed)
Follow vitamin D level.  

## 2020-05-23 NOTE — Assessment & Plan Note (Signed)
On simvastatin.  Low cholesterol diet and exercise.  Follow lipid panel and liver function tests.   

## 2020-05-27 ENCOUNTER — Other Ambulatory Visit: Payer: Federal, State, Local not specified - PPO

## 2020-06-21 ENCOUNTER — Encounter: Payer: Self-pay | Admitting: Ophthalmology

## 2020-06-21 ENCOUNTER — Other Ambulatory Visit: Payer: Self-pay

## 2020-06-24 ENCOUNTER — Other Ambulatory Visit: Payer: Self-pay

## 2020-06-24 ENCOUNTER — Other Ambulatory Visit
Admission: RE | Admit: 2020-06-24 | Discharge: 2020-06-24 | Disposition: A | Discharge status: Home / Self Care | Payer: Federal, State, Local not specified - PPO | Source: Ambulatory Visit | Attending: Ophthalmology | Admitting: Ophthalmology

## 2020-06-24 DIAGNOSIS — Z01812 Encounter for preprocedural laboratory examination: Secondary | ICD-10-CM | POA: Insufficient documentation

## 2020-06-24 DIAGNOSIS — Z20822 Contact with and (suspected) exposure to covid-19: Secondary | ICD-10-CM | POA: Diagnosis not present

## 2020-06-25 LAB — SARS CORONAVIRUS 2 (TAT 6-24 HRS): SARS Coronavirus 2: NEGATIVE

## 2020-06-25 NOTE — Discharge Instructions (Signed)

## 2020-06-28 ENCOUNTER — Other Ambulatory Visit: Payer: Self-pay

## 2020-06-28 ENCOUNTER — Encounter: Payer: Self-pay | Admitting: Ophthalmology

## 2020-06-28 ENCOUNTER — Ambulatory Visit: Payer: Federal, State, Local not specified - PPO | Admitting: Anesthesiology

## 2020-06-28 ENCOUNTER — Ambulatory Visit
Admission: RE | Admit: 2020-06-28 | Discharge: 2020-06-28 | Disposition: A | Discharge status: Home / Self Care | Payer: Federal, State, Local not specified - PPO | Attending: Ophthalmology | Admitting: Ophthalmology

## 2020-06-28 ENCOUNTER — Encounter
Admission: RE | Disposition: A | Discharge status: Home / Self Care | Payer: Self-pay | Source: Home / Self Care | Attending: Ophthalmology

## 2020-06-28 DIAGNOSIS — Z87891 Personal history of nicotine dependence: Secondary | ICD-10-CM | POA: Diagnosis not present

## 2020-06-28 DIAGNOSIS — H2512 Age-related nuclear cataract, left eye: Secondary | ICD-10-CM | POA: Insufficient documentation

## 2020-06-28 DIAGNOSIS — H401121 Primary open-angle glaucoma, left eye, mild stage: Secondary | ICD-10-CM | POA: Diagnosis not present

## 2020-06-28 HISTORY — DX: Unspecified osteoarthritis, unspecified site: M19.90

## 2020-06-28 HISTORY — PX: CATARACT EXTRACTION W/PHACO: SHX586

## 2020-06-28 HISTORY — DX: Motion sickness, initial encounter: T75.3XXA

## 2020-06-28 SURGERY — PHACOEMULSIFICATION, CATARACT, WITH IOL INSERTION
Anesthesia: Monitor Anesthesia Care | Site: Eye | Laterality: Left

## 2020-06-28 MED ORDER — TETRACAINE HCL 0.5 % OP SOLN
1.0000 [drp] | OPHTHALMIC | Status: DC | PRN
  Administered 2020-06-28 (×3): 1 [drp] via OPHTHALMIC

## 2020-06-28 MED ORDER — LIDOCAINE HCL (PF) 2 % IJ SOLN
INTRAOCULAR | Status: DC | PRN
  Administered 2020-06-28: 1 mL via INTRAOCULAR

## 2020-06-28 MED ORDER — FENTANYL CITRATE (PF) 100 MCG/2ML IJ SOLN
INTRAMUSCULAR | Status: DC | PRN
  Administered 2020-06-28: 50 ug via INTRAVENOUS

## 2020-06-28 MED ORDER — SODIUM HYALURONATE 23 MG/ML IO SOLN
INTRAOCULAR | Status: DC | PRN
  Administered 2020-06-28: 0.6 mL via INTRAOCULAR

## 2020-06-28 MED ORDER — SODIUM HYALURONATE 10 MG/ML IO SOLN
INTRAOCULAR | Status: DC | PRN
  Administered 2020-06-28: 0.55 mL via INTRAOCULAR

## 2020-06-28 MED ORDER — LACTATED RINGERS IV SOLN: INTRAVENOUS | Status: DC

## 2020-06-28 MED ORDER — EPINEPHRINE PF 1 MG/ML IJ SOLN
INTRAOCULAR | Status: DC | PRN
  Administered 2020-06-28: 122 mL via OPHTHALMIC

## 2020-06-28 MED ORDER — MIDAZOLAM HCL 2 MG/2ML IJ SOLN
INTRAMUSCULAR | Status: DC | PRN
  Administered 2020-06-28: 2 mg via INTRAVENOUS

## 2020-06-28 MED ORDER — ARMC OPHTHALMIC DILATING DROPS
1.0000 "application " | OPHTHALMIC | Status: DC | PRN
  Administered 2020-06-28 (×3): 1 via OPHTHALMIC

## 2020-06-28 MED ORDER — MOXIFLOXACIN HCL 0.5 % OP SOLN
OPHTHALMIC | Status: DC | PRN
  Administered 2020-06-28: 0.2 mL via OPHTHALMIC

## 2020-06-28 SURGICAL SUPPLY — 21 items
CANNULA ANT/CHMB 27GA (MISCELLANEOUS) ×6 IMPLANT
DEVICE INJECT ISTENT W (Stent) ×1 IMPLANT
DISSECTOR HYDRO NUCLEUS 50X22 (MISCELLANEOUS) ×3 IMPLANT
FORCEPS MICRO-HOLDING 23GA (INSTRUMENTS) ×3 IMPLANT
GLOVE SURG LX 7.5 STRW (GLOVE) ×4
GLOVE SURG LX STRL 7.5 STRW (GLOVE) ×2 IMPLANT
GLOVE SURG SYN 8.5  E (GLOVE) ×2
GLOVE SURG SYN 8.5 E (GLOVE) ×1 IMPLANT
GOWN STRL REUS W/ TWL LRG LVL3 (GOWN DISPOSABLE) ×2 IMPLANT
GOWN STRL REUS W/TWL LRG LVL3 (GOWN DISPOSABLE) ×6
ICLIP (OPHTHALMIC RELATED) ×3 IMPLANT
INJECT ISTENT W (Stent) ×3 IMPLANT
LENS IOL TECNIS EYHANCE 22.5 (Intraocular Lens) ×3 IMPLANT
MARKER SKIN DUAL TIP RULER LAB (MISCELLANEOUS) ×3 IMPLANT
PACK DR. KING ARMS (PACKS) ×3 IMPLANT
PACK EYE AFTER SURG (MISCELLANEOUS) ×3 IMPLANT
PACK OPTHALMIC (MISCELLANEOUS) ×3 IMPLANT
SYR 3ML LL SCALE MARK (SYRINGE) ×3 IMPLANT
SYR TB 1ML LUER SLIP (SYRINGE) ×3 IMPLANT
WATER STERILE IRR 250ML POUR (IV SOLUTION) ×3 IMPLANT
WIPE NON LINTING 3.25X3.25 (MISCELLANEOUS) ×3 IMPLANT

## 2020-06-28 NOTE — H&P (Signed)
[facility]   Primary Care Physician:  Dale Weir, MD Ophthalmologist: Dr. Willey Blade  Pre-Procedure History & Physical: HPI:  Vickie Crawford is a 74 y.o. female here for cataract surgery +istent.   Past Medical History:  Diagnosis Date  . Allergy   . Arthritis    fingers, toes  . Hypercholesterolemia   . Motion sickness    Rough ocean waters  . Osteopenia   . Ovarian cyst    Hospitalized 1969 and 1974 for removal  . Thyroid nodule   . Vitamin D deficiency     Past Surgical History:  Procedure Laterality Date  . COLONOSCOPY W/ POLYPECTOMY    . COLONOSCOPY WITH PROPOFOL N/A 03/07/2017   Procedure: COLONOSCOPY WITH PROPOFOL;  Surgeon: Scot Jun, MD;  Location: [facility] ENDOSCOPY;  Service: Endoscopy;  Laterality: N/A;  . OVARIAN CYST REMOVAL     1969 and 1974    Prior to Admission medications   Medication Sig Start Date End Date Taking? Authorizing Provider  Calcium Carbonate-Vitamin D (CALTRATE 600+D PO) Take 1 tablet by mouth 2 (two) times daily.   Yes [provider]  cholecalciferol (VITAMIN D) 1000 UNITS tablet Take 1,000 Units by mouth daily.   Yes [provider]  desonide (DESOWEN) 0.05 % ointment at bedtime. 10/29/14  Yes [provider]  dorzolamide-timolol (COSOPT) 22.3-6.8 MG/ML ophthalmic solution  09/03/18  Yes [provider]  fexofenadine (ALLEGRA) 180 MG tablet Take 180 mg by mouth daily.   Yes [provider]  fluticasone (FLONASE) 50 MCG/ACT nasal spray PLACE 2 SPRAYS DAILY INTO BOTH NOSTRILS. 09/09/19  Yes Dale Kula, MD  latanoprost (XALATAN) 0.005 % ophthalmic solution INSTILL ONE DROP IN BOTH EYES AT BEDTIME. 06/18/15  Yes [provider]  meloxicam (MOBIC) 7.5 MG tablet TAKE 1 TABLET BY MOUTH EVERY DAY AS NEEDED 02/12/20  Yes Dale Whitfield, MD  pyridOXINE (VITAMIN B-6) 100 MG tablet Take 100 mg by mouth daily.   Yes [provider]  simvastatin (ZOCOR) 20 MG tablet TAKE 1  TABLET (20 MG TOTAL) AT BEDTIME BY MOUTH. 01/16/20  Yes Dale Ottumwa, MD    Allergies as of 05/10/2020  . (No Known Allergies)    Family History  Problem Relation Age of Onset  . Hypertension Mother   . Heart disease Mother   . Stroke Father   . Brain cancer Sister   . Breast cancer Neg Hx     Social History   Socioeconomic History  . Marital status: Married    Spouse name: Not on file  . Number of children: 2  . Years of education: Not on file  . Highest education level: Not on file  Occupational History  . Not on file  Tobacco Use  . Smoking status: Former Smoker    Types: Cigarettes    Quit date: 1995    Years since quitting: 26.8  . Smokeless tobacco: Never Used  Vaping Use  . Vaping Use: Never used  Substance and Sexual Activity  . Alcohol use: Yes    Alcohol/week: 2.0 standard drinks    Types: 2 Glasses of wine per week  . Drug use: No  . Sexual activity: Not on file  Other Topics Concern  . Not on file  Social History Narrative  . Not on file   Social Determinants of Health   Financial Resource Strain:   . Difficulty of Paying Living Expenses: Not on file  Food Insecurity:   . Worried About Radiation protection practitioner  of Food in the Last Year: Not on file  . Ran Out of Food in the Last Year: Not on file  Transportation Needs:   . Lack of Transportation (Medical): Not on file  . Lack of Transportation (Non-Medical): Not on file  Physical Activity:   . Days of Exercise per Week: Not on file  . Minutes of Exercise per Session: Not on file  Stress:   . Feeling of Stress : Not on file  Social Connections:   . Frequency of Communication with Friends and Family: Not on file  . Frequency of Social Gatherings with Friends and Family: Not on file  . Attends Religious Services: Not on file  . Active Member of Clubs or Organizations: Not on file  . Attends Banker Meetings: Not on file  . Marital Status: Not on file  Intimate Partner Violence:   . Fear of  Current or Ex-Partner: Not on file  . Emotionally Abused: Not on file  . Physically Abused: Not on file  . Sexually Abused: Not on file    Review of Systems: See HPI, otherwise negative ROS  Physical Exam: BP (!) 143/77   Pulse 66   Temp 98.6 F (37 C) (Temporal)   Ht 5\' 1"  (1.549 m)   Wt 60.8 kg   SpO2 98%   BMI 25.32 kg/m  General:   Alert,  pleasant and cooperative in NAD Head:  Normocephalic and atraumatic.   Impression/Plan: Vickie Crawford is here for cataract surgery +istent.  Risks, benefits, limitations, and alternatives regarding cataract surgery have been reviewed with the patient.  Questions have been answered.  All parties agreeable.   Jonnie Kind, MD  06/28/2020, 10:46 AM

## 2020-06-28 NOTE — Transfer of Care (Signed)
Immediate Anesthesia Transfer of Care Note  Patient: Vickie Crawford  Procedure(s) Performed: CATARACT EXTRACTION PHACO AND INTRAOCULAR LENS PLACEMENT (IOC) LEFT ISTENT INJ (Left Eye)  Patient Location: PACU  Anesthesia Type: MAC  Level of Consciousness: awake, alert  and patient cooperative  Airway and Oxygen Therapy: Patient Spontanous Breathing and Patient connected to supplemental oxygen  Post-op Assessment: Post-op Vital signs reviewed, Patient's Cardiovascular Status Stable, Respiratory Function Stable, Patent Airway and No signs of Nausea or vomiting  Post-op Vital Signs: Reviewed and stable  Complications: No complications documented.

## 2020-06-28 NOTE — Op Note (Signed)
OPERATIVE NOTE  Vickie Crawford [id number] 06/28/2020  PREOPERATIVE DIAGNOSIS:   1.  Mild  PRIMARY open angle glaucoma, left eye. 2.  Nuclear sclerotic cataract left eye.  H25.12   POSTOPERATIVE DIAGNOSIS:    same.   PROCEDURE:   1.  Placement of trabecular bypass stent (istent). CPT 0191T  and placement of additional stent, LEFT EYE  CPT 0376T 2.  Phacoemusification with posterior chamber intraocular lens placement of the left eye  CPT 4428079776   LENS: Implant Name Type Inv. Item Serial No. Manufacturer Lot No. LRB No. Used Action  LENS IOL TECNIS EYHANCE 22.5 - [id number] Intraocular Lens LENS IOL TECNIS EYHANCE 22.5 [id number] [facility]   Left 1 Implanted  Marko Stai - [id number] Stent INJECT ISTENT Viona Gilmore [id number] [place] [id number] Left 1 Implanted      Procedure(s) with comments: CATARACT EXTRACTION PHACO AND INTRAOCULAR LENS PLACEMENT (IOC) LEFT ISTENT INJ (Left) - 7.96 0:52.3  ULTRASOUND TIME: 0 minutes 52 seconds.  CDE 7.96   SURGEON:  Benay Pillow, MD, MPH  ANESTHESIOLOGIST: Anesthesiologist: Ardeth Sportsman, MD CRNA: Jeannene Patella, CRNA   ANESTHESIA:  MAC and intracameral preservative-free intracameral lidocaine 4%.  ESTIMATED BLOOD LOSS: less than 1 mL.   COMPLICATIONS:  Anterior capsular tear at 1:00, with no posterior extension, no loss of vitreous.   DESCRIPTION OF PROCEDURE:  The patient was identified in the holding room and transported to the operating room.  The patient was placed in the supine position under the operating microscope.  The left eye was prepped and draped in the usual sterile ophthalmic fashion.   A 1.0 millimeter clear-corneal paracentesis was made at the 4:30 position. 0.5 ml of preservative-free 1% lidocaine with epinephrine was injected into the anterior chamber.  The anterior chamber was filled with Healon 5 viscoelastic.  A 2.4 millimeter keratome was used to make a near-clear corneal incision at the 2:00 position.    Attention was turned to the istent.  The patients head was turned to the left and the microscope was tilted to 035 degrees.  Ocular instruments/Glaukos OAL/H2 gonioprism was used with IPC05 (iclip) coupled with Healon 5 on the cornea was used to visualize the trabecular meshwork. The istent was opened and introduced into the eye.  The meshwork was engaged with the tip of the iStent injector and the stent was deployed into Schlemm's canal at 10:30.  The second stent was deployed at 8:00.  The stents were well seated and in good position.  Next, attention was turned to the phacoemulsification A curvilinear capsulorrhexis was made with a cystotome and capsulorrhexis forceps.  There was a small anterior capsular run out at 1:00 created after regrasping the flap, but this remained stable for the rest of the case.  Balanced salt solution was used to hydrodissect and hydrodelineate the nucleus.   Phacoemulsification was then used in stop and chop fashion to remove the lens nucleus and epinucleus.  The remaining cortex was then removed using the irrigation and aspiration handpiece. Healon was then placed into the capsular bag to distend it for lens placement.  A lens was then injected into the capsular bag.  The remaining viscoelastic was aspirated.   Wounds were hydrated with balanced salt solution.  The anterior chamber was inflated to a physiologic pressure with balanced salt solution.   Intracameral vigamox 0.1 mL undiluted was injected into the eye and a drop placed onto the ocular surface.  No wound leaks were noted.  Protective glasses were placed on the  patient.  The patient was taken to the recovery room in stable condition.  Benay Pillow 06/28/2020, 11:47 AM

## 2020-06-28 NOTE — Anesthesia Preprocedure Evaluation (Signed)
Anesthesia Evaluation  Patient identified by MRN, date of birth, ID band Patient awake    Reviewed: Allergy & Precautions, NPO status , Patient's Chart, lab work & pertinent test results  Airway Mallampati: II  TM Distance: >3 FB Neck ROM: Full    Dental no notable dental hx.    Pulmonary former smoker,    Pulmonary exam normal        Cardiovascular negative cardio ROS Normal cardiovascular exam     Neuro/Psych negative psych ROS   GI/Hepatic negative GI ROS, Neg liver ROS,   Endo/Other  negative endocrine ROS  Renal/GU negative Renal ROS     Musculoskeletal  (+) Arthritis , Osteoarthritis,    Abdominal Normal abdominal exam  (+)   Peds  Hematology   Anesthesia Other Findings   Reproductive/Obstetrics                             Anesthesia Physical Anesthesia Plan  ASA: II  Anesthesia Plan: MAC   Post-op Pain Management:    Induction: Intravenous  PONV Risk Score and Plan: 2 and Midazolam, TIVA and Treatment may vary due to age or medical condition  Airway Management Planned: Natural Airway and Nasal Cannula  Additional Equipment: None  Intra-op Plan:   Post-operative Plan:   Informed Consent: I have reviewed the patients History and Physical, chart, labs and discussed the procedure including the risks, benefits and alternatives for the proposed anesthesia with the patient or authorized representative who has indicated his/her understanding and acceptance.     Dental advisory given  Plan Discussed with: CRNA  Anesthesia Plan Comments:         Anesthesia Quick Evaluation

## 2020-06-28 NOTE — Anesthesia Postprocedure Evaluation (Signed)
Anesthesia Post Note  Patient: Vickie Crawford  Procedure(s) Performed: CATARACT EXTRACTION PHACO AND INTRAOCULAR LENS PLACEMENT (IOC) LEFT ISTENT INJ (Left Eye)     Patient location during evaluation: PACU Anesthesia Type: MAC Level of consciousness: awake and alert Pain management: pain level controlled Vital Signs Assessment: post-procedure vital signs reviewed and stable Respiratory status: spontaneous breathing and nonlabored ventilation Cardiovascular status: blood pressure returned to baseline Postop Assessment: no apparent nausea or vomiting Anesthetic complications: no   No complications documented.  Mikhala Kenan [employer]

## 2020-06-28 NOTE — Anesthesia Procedure Notes (Signed)
Procedure Name: MAC Date/Time: 06/28/2020 11:13 AM Performed by: Jeannene Patella, CRNA Pre-anesthesia Checklist: Patient identified, Emergency Drugs available, Suction available, Timeout performed and Patient being monitored Patient Re-evaluated:Patient Re-evaluated prior to induction Oxygen Delivery Method: Nasal cannula Placement Confirmation: positive ETCO2

## 2020-06-29 ENCOUNTER — Encounter: Payer: Self-pay | Admitting: Ophthalmology

## 2020-07-21 ENCOUNTER — Other Ambulatory Visit: Payer: Self-pay | Admitting: Internal Medicine

## 2020-07-21 ENCOUNTER — Encounter: Payer: Self-pay | Admitting: Ophthalmology

## 2020-07-29 ENCOUNTER — Other Ambulatory Visit
Admission: RE | Admit: 2020-07-29 | Discharge: 2020-07-29 | Disposition: A | Discharge status: Home / Self Care | Payer: Federal, State, Local not specified - PPO | Source: Ambulatory Visit | Attending: Ophthalmology | Admitting: Ophthalmology

## 2020-07-29 ENCOUNTER — Other Ambulatory Visit: Payer: Self-pay

## 2020-07-29 DIAGNOSIS — Z20822 Contact with and (suspected) exposure to covid-19: Secondary | ICD-10-CM | POA: Insufficient documentation

## 2020-07-29 DIAGNOSIS — Z01812 Encounter for preprocedural laboratory examination: Secondary | ICD-10-CM | POA: Insufficient documentation

## 2020-07-29 LAB — SARS CORONAVIRUS 2 (TAT 6-24 HRS): SARS Coronavirus 2: NEGATIVE

## 2020-07-29 NOTE — Discharge Instructions (Signed)

## 2020-08-01 LAB — SARS CORONAVIRUS 2 (TAT 6-24 HRS): SARS Coronavirus 2: NEGATIVE

## 2020-08-02 ENCOUNTER — Encounter: Payer: Self-pay | Admitting: Ophthalmology

## 2020-08-02 ENCOUNTER — Ambulatory Visit
Admission: RE | Admit: 2020-08-02 | Discharge: 2020-08-02 | Disposition: A | Discharge status: Home / Self Care | Payer: Federal, State, Local not specified - PPO | Attending: Ophthalmology | Admitting: Ophthalmology

## 2020-08-02 ENCOUNTER — Other Ambulatory Visit: Payer: Self-pay

## 2020-08-02 ENCOUNTER — Encounter
Admission: RE | Disposition: A | Discharge status: Home / Self Care | Payer: Self-pay | Source: Home / Self Care | Attending: Ophthalmology

## 2020-08-02 ENCOUNTER — Ambulatory Visit: Payer: Federal, State, Local not specified - PPO | Admitting: Anesthesiology

## 2020-08-02 DIAGNOSIS — Z79899 Other long term (current) drug therapy: Secondary | ICD-10-CM | POA: Insufficient documentation

## 2020-08-02 DIAGNOSIS — Z791 Long term (current) use of non-steroidal anti-inflammatories (NSAID): Secondary | ICD-10-CM | POA: Insufficient documentation

## 2020-08-02 DIAGNOSIS — Z87891 Personal history of nicotine dependence: Secondary | ICD-10-CM | POA: Insufficient documentation

## 2020-08-02 DIAGNOSIS — H401111 Primary open-angle glaucoma, right eye, mild stage: Secondary | ICD-10-CM | POA: Diagnosis not present

## 2020-08-02 DIAGNOSIS — H2511 Age-related nuclear cataract, right eye: Secondary | ICD-10-CM | POA: Insufficient documentation

## 2020-08-02 HISTORY — PX: CATARACT EXTRACTION W/PHACO: SHX586

## 2020-08-02 SURGERY — PHACOEMULSIFICATION, CATARACT, WITH IOL INSERTION
Anesthesia: Monitor Anesthesia Care | Site: Eye | Laterality: Right

## 2020-08-02 MED ORDER — LIDOCAINE HCL (PF) 2 % IJ SOLN
INTRAOCULAR | Status: DC | PRN
  Administered 2020-08-02: 1 mL via INTRAOCULAR

## 2020-08-02 MED ORDER — ACETAMINOPHEN 160 MG/5ML PO SOLN: 325.0000 mg | ORAL | Status: DC | PRN

## 2020-08-02 MED ORDER — ARMC OPHTHALMIC DILATING DROPS
1.0000 "application " | OPHTHALMIC | Status: DC | PRN
  Administered 2020-08-02 (×3): 1 via OPHTHALMIC

## 2020-08-02 MED ORDER — BRIMONIDINE TARTRATE-TIMOLOL 0.2-0.5 % OP SOLN
OPHTHALMIC | Status: DC | PRN
  Administered 2020-08-02: 1 [drp] via OPHTHALMIC

## 2020-08-02 MED ORDER — TETRACAINE HCL 0.5 % OP SOLN
1.0000 [drp] | OPHTHALMIC | Status: DC | PRN
  Administered 2020-08-02 (×3): 1 [drp] via OPHTHALMIC

## 2020-08-02 MED ORDER — MOXIFLOXACIN HCL 0.5 % OP SOLN
OPHTHALMIC | Status: DC | PRN
  Administered 2020-08-02: 0.2 mL via OPHTHALMIC

## 2020-08-02 MED ORDER — SODIUM HYALURONATE 23 MG/ML IO SOLN
INTRAOCULAR | Status: DC | PRN
  Administered 2020-08-02: 0.6 mL via INTRAOCULAR

## 2020-08-02 MED ORDER — ACETAMINOPHEN 325 MG PO TABS: 325.0000 mg | ORAL_TABLET | ORAL | Status: DC | PRN

## 2020-08-02 MED ORDER — FENTANYL CITRATE (PF) 100 MCG/2ML IJ SOLN
INTRAMUSCULAR | Status: DC | PRN
  Administered 2020-08-02: 50 ug via INTRAVENOUS

## 2020-08-02 MED ORDER — MIDAZOLAM HCL 2 MG/2ML IJ SOLN
INTRAMUSCULAR | Status: DC | PRN
  Administered 2020-08-02: 1 mg via INTRAVENOUS

## 2020-08-02 MED ORDER — ONDANSETRON HCL 4 MG/2ML IJ SOLN: 4.0000 mg | Freq: Once | INTRAMUSCULAR | Status: DC | PRN

## 2020-08-02 MED ORDER — EPINEPHRINE PF 1 MG/ML IJ SOLN
INTRAOCULAR | Status: DC | PRN
  Administered 2020-08-02: 70 mL via OPHTHALMIC

## 2020-08-02 MED ORDER — SODIUM HYALURONATE 10 MG/ML IO SOLN
INTRAOCULAR | Status: DC | PRN
  Administered 2020-08-02: 0.55 mL via INTRAOCULAR

## 2020-08-02 SURGICAL SUPPLY — 21 items
CANNULA ANT/CHMB 27G (MISCELLANEOUS) ×2 IMPLANT
CANNULA ANT/CHMB 27GA (MISCELLANEOUS) ×6 IMPLANT
DEVICE INJECT ISTENT W (Stent) IMPLANT
DISSECTOR HYDRO NUCLEUS 50X22 (MISCELLANEOUS) ×3 IMPLANT
GLOVE SURG LX 7.5 STRW (GLOVE) ×4
GLOVE SURG LX STRL 7.5 STRW (GLOVE) ×1 IMPLANT
GLOVE SURG SYN 8.5  E (GLOVE) ×2
GLOVE SURG SYN 8.5 E (GLOVE) ×1 IMPLANT
GLOVE SURG SYN 8.5 PF PI (GLOVE) ×1 IMPLANT
GOWN STRL REUS W/ TWL LRG LVL3 (GOWN DISPOSABLE) ×2 IMPLANT
GOWN STRL REUS W/TWL LRG LVL3 (GOWN DISPOSABLE) ×6
INJECT ISTENT W (Stent) ×3 IMPLANT
LENS IOL TECNIS EYHANCE 19.5 (Intraocular Lens) ×2 IMPLANT
MARKER SKIN DUAL TIP RULER LAB (MISCELLANEOUS) ×3 IMPLANT
PACK DR. KING ARMS (PACKS) ×3 IMPLANT
PACK EYE AFTER SURG (MISCELLANEOUS) ×3 IMPLANT
PACK OPTHALMIC (MISCELLANEOUS) ×3 IMPLANT
SYR 3ML LL SCALE MARK (SYRINGE) ×3 IMPLANT
SYR TB 1ML LUER SLIP (SYRINGE) ×3 IMPLANT
WATER STERILE IRR 250ML POUR (IV SOLUTION) ×3 IMPLANT
WIPE NON LINTING 3.25X3.25 (MISCELLANEOUS) ×3 IMPLANT

## 2020-08-02 NOTE — Anesthesia Preprocedure Evaluation (Signed)
Anesthesia Evaluation  Patient identified by MRN, date of birth, ID band Patient awake    Reviewed: Allergy & Precautions, NPO status   Airway Mallampati: II  TM Distance: >3 FB     Dental   Pulmonary former smoker,    breath sounds clear to auscultation       Cardiovascular  Rhythm:Regular Rate:Normal  HLD   Neuro/Psych    GI/Hepatic   Endo/Other    Renal/GU      Musculoskeletal  (+) Arthritis , Osteopenia    Abdominal   Peds  Hematology   Anesthesia Other Findings   Reproductive/Obstetrics                             Anesthesia Physical Anesthesia Plan  ASA: II  Anesthesia Plan: MAC   Post-op Pain Management:    Induction: Intravenous  PONV Risk Score and Plan: TIVA, Midazolam and Treatment may vary due to age or medical condition  Airway Management Planned: Natural Airway and Nasal Cannula  Additional Equipment:   Intra-op Plan:   Post-operative Plan:   Informed Consent: I have reviewed the patients History and Physical, chart, labs and discussed the procedure including the risks, benefits and alternatives for the proposed anesthesia with the patient or authorized representative who has indicated his/her understanding and acceptance.       Plan Discussed with: CRNA  Anesthesia Plan Comments:         Anesthesia Quick Evaluation

## 2020-08-02 NOTE — Op Note (Signed)
OPERATIVE NOTE  TAMBRA MULLER [id number] 08/02/2020  PREOPERATIVE DIAGNOSIS:   1.  Mild PRIMARY open angle glaucoma, right eye. H40.1111 2.  Nuclear sclerotic cataract right eye.  H25.11   POSTOPERATIVE DIAGNOSIS:    same.   PROCEDURE:   1.  Placement of trabecular bypass stent (istent). CPT 0191T  and placement of additional stent  CPT 0376T 2.  Phacoemusification with posterior chamber intraocular lens placement of the right eye  CPT [phone]   LENS: Implant Name Type Inv. Item Serial No. Manufacturer Lot No. LRB No. Used Action  LENS IOL TECNIS EYHANCE 19.5 - [id number] Intraocular Lens LENS IOL TECNIS EYHANCE 19.5 [id number] [facility]   Right 1 Implanted  Marko Stai - [id number] Stent INJECT [employer] [id number] Right 1 Implanted      Procedure(s) with comments: CATARACT EXTRACTION PHACO AND INTRAOCULAR LENS PLACEMENT (IOC) RIGHT ISTENT INJ (Right) - 1.84 0:20.0  DIB00 +19.5    ULTRASOUND TIME: 0 minutes 20 seconds.  CDE 1.84   SURGEON:  Benay Pillow, MD, MPH  ANESTHESIOLOGIST: Anesthesiologist: Veda Canning, MD CRNA: Cameron Ali, CRNA   ANESTHESIA:  MAC and intracameral preservative-free lidocaine 4%.  ESTIMATED BLOOD LOSS: less than 1 mL.   COMPLICATIONS:  None.   DESCRIPTION OF PROCEDURE:  The patient was identified in the holding room and transported to the operating room.   The patient was placed in the supine position under the operating microscope.  The right eye was prepped and draped in the usual sterile ophthalmic fashion.   A 1.0 millimeter clear-corneal paracentesis was made at the 10:30 position. 0.5 ml of preservative-free 1% lidocaine with epinephrine was injected into the anterior chamber.  The anterior chamber was filled with Healon 5 viscoelastic.  A 2.4 millimeter keratome was used to make a near-clear corneal incision at the 8:00 position.   Attention was turned to the istent.  The patients head was turned to the left and the  microscope was tilted to 035 degrees.  Ocular instruments/Glaukos OAL/H2 gonioprism was used with IPC05 (iclip) coupled with Healon 5 on the cornea was used to visualize the trabecular meshwork. The istent was opened and introduced into the eye.  The meshwork was engaged with the tip of the iStent injector and the stent was deployed into Schlemm's canal at 2:00.  The second stent was deployed at 4:00.  The stents were well seated and in good position.  Next, attention was turned to the phacoemulsification A curvilinear capsulorrhexis was made with a cystotome and capsulorrhexis forceps.  Balanced salt solution was used to hydrodissect and hydrodelineate the nucleus.   Phacoemulsification was then used in stop and chop fashion to remove the lens nucleus and epinucleus.  The remaining cortex was then removed using the irrigation and aspiration handpiece. Healon was then placed into the capsular bag to distend it for lens placement.  A lens was then injected into the capsular bag.  The remaining viscoelastic was aspirated.   Wounds were hydrated with balanced salt solution.  The anterior chamber was inflated to a physiologic pressure with balanced salt solution.   Intracameral vigamox 0.1 mL undiluted was injected into the eye and a drop placed onto the ocular surface.  No wound leaks were noted. The patient was taken to the recovery room in stable condition without complications of anesthesia or surgery   Benay Pillow 08/02/2020, 12:33 PM

## 2020-08-02 NOTE — Anesthesia Postprocedure Evaluation (Signed)
Anesthesia Post Note  Patient: Vickie Crawford  Procedure(s) Performed: CATARACT EXTRACTION PHACO AND INTRAOCULAR LENS PLACEMENT (IOC) RIGHT ISTENT INJ (Right Eye)     Patient location during evaluation: PACU Anesthesia Type: MAC Level of consciousness: awake Pain management: pain level controlled Vital Signs Assessment: post-procedure vital signs reviewed and stable Respiratory status: respiratory function stable Cardiovascular status: stable Postop Assessment: no apparent nausea or vomiting Anesthetic complications: no   No complications documented.  Veda Canning

## 2020-08-02 NOTE — H&P (Signed)
[facility]   Primary Care Physician:  Dale [place], MD Ophthalmologist: Dr. Willey Blade  Pre-Procedure History & Physical: HPI:  Vickie Crawford is a 74 y.o. female here for cataract surgery.   Past Medical History:  Diagnosis Date  . Allergy   . Arthritis    fingers, toes  . Hypercholesterolemia   . Motion sickness    Rough ocean waters  . Osteopenia   . Ovarian cyst    Hospitalized 1969 and 1974 for removal  . Thyroid nodule   . Vitamin D deficiency     Past Surgical History:  Procedure Laterality Date  . CATARACT EXTRACTION W/PHACO Left 06/28/2020   Procedure: CATARACT EXTRACTION PHACO AND INTRAOCULAR LENS PLACEMENT (IOC) LEFT ISTENT INJ;  Surgeon: Nevada Crane, MD;  Location: [facility];  Service: Ophthalmology;  Laterality: Left;  7.96 0:52.3  . COLONOSCOPY W/ POLYPECTOMY    . COLONOSCOPY WITH PROPOFOL N/A 03/07/2017   Procedure: COLONOSCOPY WITH PROPOFOL;  Surgeon: Scot Jun, MD;  Location: [facility] ENDOSCOPY;  Service: Endoscopy;  Laterality: N/A;  . OVARIAN CYST REMOVAL     1969 and 1974    Prior to Admission medications   Medication Sig Start Date End Date Taking? Authorizing Provider  Calcium Carbonate-Vitamin D (CALTRATE 600+D PO) Take 1 tablet by mouth 2 (two) times daily.   Yes [provider]  cholecalciferol (VITAMIN D) 1000 UNITS tablet Take 1,000 Units by mouth daily.   Yes [provider]  desonide (DESOWEN) 0.05 % ointment at bedtime. 10/29/14  Yes [provider]  dorzolamide-timolol (COSOPT) 22.3-6.8 MG/ML ophthalmic solution  09/03/18  Yes [provider]  fexofenadine (ALLEGRA) 180 MG tablet Take 180 mg by mouth daily.   Yes [provider]  fluticasone (FLONASE) 50 MCG/ACT nasal spray PLACE 2 SPRAYS DAILY INTO BOTH NOSTRILS. 09/09/19  Yes Dale [place], MD  latanoprost (XALATAN) 0.005 % ophthalmic solution INSTILL ONE DROP IN BOTH EYES AT BEDTIME. 06/18/15  Yes [provider]  meloxicam (MOBIC) 7.5 MG tablet TAKE 1 TABLET BY MOUTH EVERY DAY AS NEEDED 07/21/20  Yes Dale [place], MD  pyridOXINE (VITAMIN B-6) 100 MG tablet Take 100 mg by mouth daily.   Yes [provider]  simvastatin (ZOCOR) 20 MG tablet TAKE 1 TABLET (20 MG TOTAL) AT BEDTIME BY MOUTH. 01/16/20  Yes Dale [place], MD    Allergies as of 07/02/2020  . (No Known Allergies)    Family History  Problem Relation Age of Onset  . Hypertension Mother   . Heart disease Mother   . Stroke Father   . Brain cancer Sister   . Breast cancer Neg Hx     Social History   Socioeconomic History  . Marital status: Married    Spouse name: Not on file  . Number of children: 2  . Years of education: Not on file  . Highest education level: Not on file  Occupational History  . Not on file  Tobacco Use  . Smoking status: Former Smoker    Types: Cigarettes    Quit date: 1995    Years since quitting: 26.9  . Smokeless tobacco: Never Used  Vaping Use  . Vaping Use: Never used  Substance and Sexual Activity  . Alcohol use: Yes    Alcohol/week: 2.0 standard drinks    Types: 2 Glasses of wine per week  . Drug use: No  . Sexual activity: Not on file  Other Topics Concern  . Not on file  Social History Narrative  . Not on file   Social Determinants of Health   Financial Resource Strain:   . Difficulty of Paying Living Expenses: Not on file  Food Insecurity:   . Worried About Programme researcher, broadcasting/film/video in the Last Year: Not on file  . Ran Out of Food in the Last Year: Not on file  Transportation Needs:   . Lack of Transportation (Medical): Not on file  . Lack of Transportation (Non-Medical): Not on file  Physical Activity:   . Days of Exercise per Week: Not on file  . Minutes of Exercise per Session: Not on file  Stress:   . Feeling of Stress : Not on file  Social Connections:   . Frequency of Communication with Friends and Family: Not on file  . Frequency of Social Gatherings with  Friends and Family: Not on file  . Attends Religious Services: Not on file  . Active Member of Clubs or Organizations: Not on file  . Attends Banker Meetings: Not on file  . Marital Status: Not on file  Intimate Partner Violence:   . Fear of Current or Ex-Partner: Not on file  . Emotionally Abused: Not on file  . Physically Abused: Not on file  . Sexually Abused: Not on file    Review of Systems: See HPI, otherwise negative ROS  Physical Exam: BP 115/60   Pulse 67   Temp (!) 97.4 F (36.3 C) (Temporal)   Resp 16   Ht 5\' 1"  (1.549 m)   Wt 60.7 kg   SpO2 98%   BMI 25.30 kg/m  General:   Alert,  pleasant and cooperative in NAD Head:  Normocephalic and atraumatic. Respiratory:  Normal work of breathing.   Impression/Plan: Vickie Crawford is here for cataract surgery.  Risks, benefits, limitations, and alternatives regarding cataract surgery have been reviewed with the patient.  Questions have been answered.  All parties agreeable.   Jonnie Kind, MD  08/02/2020, 11:59 AM

## 2020-08-02 NOTE — Transfer of Care (Signed)
Immediate Anesthesia Transfer of Care Note  Patient: Vickie Crawford  Procedure(s) Performed: CATARACT EXTRACTION PHACO AND INTRAOCULAR LENS PLACEMENT (IOC) RIGHT ISTENT INJ (Right Eye)  Patient Location: PACU  Anesthesia Type: MAC  Level of Consciousness: awake, alert  and patient cooperative  Airway and Oxygen Therapy: Patient Spontanous Breathing and Patient connected to supplemental oxygen  Post-op Assessment: Post-op Vital signs reviewed, Patient's Cardiovascular Status Stable, Respiratory Function Stable, Patent Airway and No signs of Nausea or vomiting  Post-op Vital Signs: Reviewed and stable  Complications: No complications documented.

## 2020-08-03 ENCOUNTER — Encounter: Payer: Self-pay | Admitting: Ophthalmology

## 2020-08-05 ENCOUNTER — Ambulatory Visit
Admission: EM | Admit: 2020-08-05 | Discharge: 2020-08-05 | Disposition: A | Discharge status: Home / Self Care | Payer: Federal, State, Local not specified - PPO | Attending: Urgent Care | Admitting: Urgent Care

## 2020-08-05 DIAGNOSIS — R059 Cough, unspecified: Secondary | ICD-10-CM

## 2020-08-05 DIAGNOSIS — J0191 Acute recurrent sinusitis, unspecified: Secondary | ICD-10-CM

## 2020-08-05 DIAGNOSIS — R0781 Pleurodynia: Secondary | ICD-10-CM

## 2020-08-05 DIAGNOSIS — R0981 Nasal congestion: Secondary | ICD-10-CM

## 2020-08-05 DIAGNOSIS — R0982 Postnasal drip: Secondary | ICD-10-CM

## 2020-08-05 HISTORY — DX: Unspecified glaucoma: H40.9

## 2020-08-05 MED ORDER — PREDNISONE 10 MG PO TABS: 30.0000 mg | ORAL_TABLET | Freq: Every day | ORAL | 0 refills | Status: DC

## 2020-08-05 MED ORDER — AMOXICILLIN 875 MG PO TABS: 875.0000 mg | ORAL_TABLET | Freq: Two times a day (BID) | ORAL | 0 refills | Status: DC

## 2020-08-05 NOTE — ED Provider Notes (Signed)
Vickie Crawford   MRN: [id number] DOB: [birth year]  Subjective:   Vickie Crawford is a 74 y.o. female presenting for 2-week history of persistent sinus congestion, postnasal drainage and cough that is now eliciting rib pain.  Patient has a history of sinusitis.  She was Covid tested 5 days ago and was negative.  No current facility-administered medications for this encounter.  Current Outpatient Medications:  .  Calcium Carbonate-Vitamin D (CALTRATE 600+D PO), Take 1 tablet by mouth 2 (two) times daily., Disp: , Rfl:  .  cholecalciferol (VITAMIN D) 1000 UNITS tablet, Take 1,000 Units by mouth daily., Disp: , Rfl:  .  desonide (DESOWEN) 0.05 % ointment, at bedtime., Disp: , Rfl: 0 .  dorzolamide-timolol (COSOPT) 22.3-6.8 MG/ML ophthalmic solution, , Disp: , Rfl:  .  fexofenadine (ALLEGRA) 180 MG tablet, Take 180 mg by mouth daily., Disp: , Rfl:  .  fluticasone (FLONASE) 50 MCG/ACT nasal spray, PLACE 2 SPRAYS DAILY INTO BOTH NOSTRILS., Disp: 48 mL, Rfl: 1 .  latanoprost (XALATAN) 0.005 % ophthalmic solution, INSTILL ONE DROP IN BOTH EYES AT BEDTIME., Disp: , Rfl: 5 .  meloxicam (MOBIC) 7.5 MG tablet, TAKE 1 TABLET BY MOUTH EVERY DAY AS NEEDED, Disp: 90 tablet, Rfl: 1 .  pyridOXINE (VITAMIN B-6) 100 MG tablet, Take 100 mg by mouth daily., Disp: , Rfl:  .  simvastatin (ZOCOR) 20 MG tablet, TAKE 1 TABLET (20 MG TOTAL) AT BEDTIME BY MOUTH., Disp: 90 tablet, Rfl: 1   No Known Allergies  Past Medical History:  Diagnosis Date  . Allergy   . Arthritis    fingers, toes  . Glaucoma   . Hypercholesterolemia   . Motion sickness    Rough ocean waters  . Osteopenia   . Ovarian cyst    Hospitalized 1969 and 1974 for removal  . Thyroid nodule   . Vitamin D deficiency      Past Surgical History:  Procedure Laterality Date  . CATARACT EXTRACTION W/PHACO Left 06/28/2020   Procedure: CATARACT EXTRACTION PHACO AND INTRAOCULAR LENS PLACEMENT (IOC) LEFT ISTENT INJ;  Surgeon: Nevada Crane, MD;  Location: [facility];  Service: Ophthalmology;  Laterality: Left;  7.96 0:52.3  . CATARACT EXTRACTION W/PHACO Right 08/02/2020   Procedure: CATARACT EXTRACTION PHACO AND INTRAOCULAR LENS PLACEMENT (IOC) RIGHT ISTENT INJ;  Surgeon: Nevada Crane, MD;  Location: [facility];  Service: Ophthalmology;  Laterality: Right;  1.84 0:20.0  . COLONOSCOPY W/ POLYPECTOMY    . COLONOSCOPY WITH PROPOFOL N/A 03/07/2017   Procedure: COLONOSCOPY WITH PROPOFOL;  Surgeon: Scot Jun, MD;  Location: [facility] ENDOSCOPY;  Service: Endoscopy;  Laterality: N/A;  . OVARIAN CYST REMOVAL     1969 and 1974    Family History  Problem Relation Age of Onset  . Hypertension Mother   . Heart disease Mother   . Stroke Father   . Brain cancer Sister   . Breast cancer Neg Hx     Social History   Tobacco Use  . Smoking status: Former Smoker    Types: Cigarettes    Quit date: 1995    Years since quitting: 26.9  . Smokeless tobacco: Never Used  Vaping Use  . Vaping Use: Never used  Substance Use Topics  . Alcohol use: Yes    Alcohol/week: 2.0 standard drinks    Types: 2 Glasses of wine per week  . Drug use: No    ROS   Objective:   Vitals: BP (!) 157/113 (BP Location: Left  Arm)   Pulse 73   Temp 98.2 F (36.8 C)   Resp 18   SpO2 95%   Physical Exam Constitutional:      General: She is not in acute distress.    Appearance: Normal appearance. She is well-developed. She is not ill-appearing, toxic-appearing or diaphoretic.  HENT:     Head: Normocephalic and atraumatic.     Nose: Nose normal.     Mouth/Throat:     Mouth: Mucous membranes are moist.     Comments: Streaks of post-nasal drainage overlying pharynx.  Eyes:     General: No scleral icterus.       Right eye: No discharge.        Left eye: No discharge.     Extraocular Movements: Extraocular movements intact.     Pupils: Pupils are equal, round, and reactive to light.  Cardiovascular:     Rate and  Rhythm: Normal rate and regular rhythm.     Pulses: Normal pulses.     Heart sounds: Normal heart sounds. No murmur heard. No friction rub. No gallop.   Pulmonary:     Effort: Pulmonary effort is normal. No respiratory distress.     Breath sounds: Normal breath sounds. No stridor. No wheezing, rhonchi or rales.  Skin:    General: Skin is warm and dry.     Findings: No rash.  Neurological:     General: No focal deficit present.     Mental Status: She is alert and oriented to person, place, and time.  Psychiatric:        Mood and Affect: Mood normal.        Behavior: Behavior normal.        Thought Content: Thought content normal.        Judgment: Judgment normal.     Assessment and Plan :   PDMP not reviewed this encounter.  1. Acute recurrent sinusitis, unspecified location   2. Sinus congestion   3. Post-nasal drainage   4. Cough   5. Rib pain     Will start empiric treatment for sinusitis with amoxicillin.  Given severity of her cough and fact that it is eliciting chest pain, offered her a prednisone course of 30 mg for 5 days patient was agreeable.  Recommended supportive care otherwise including the use of oral antihistamine, decongestant.  Deferred imaging given clear lung sounds.  Follow-up with PCP as soon as possible.  Counseled patient on potential for adverse effects with medications prescribed/recommended today, ER and return-to-clinic precautions discussed, patient verbalized understanding.    Wallis Bamberg, PA-C 08/05/20 1435

## 2020-08-05 NOTE — ED Triage Notes (Signed)
Pt reports cough, nasal congestion x 2 weeks.  Afebrile.  Reports rib pain and difficulty sleeping due to persistent cough.  OTC meds aren't helping at all.    Had a negative COVID test last week.

## 2020-08-15 ENCOUNTER — Other Ambulatory Visit: Payer: Self-pay | Admitting: Internal Medicine

## 2020-09-06 ENCOUNTER — Other Ambulatory Visit: Payer: Self-pay | Admitting: Internal Medicine

## 2020-11-11 ENCOUNTER — Other Ambulatory Visit (INDEPENDENT_AMBULATORY_CARE_PROVIDER_SITE_OTHER): Payer: Federal, State, Local not specified - PPO

## 2020-11-11 ENCOUNTER — Other Ambulatory Visit: Payer: Self-pay

## 2020-11-11 DIAGNOSIS — E7800 Pure hypercholesterolemia, unspecified: Secondary | ICD-10-CM

## 2020-11-11 LAB — HEPATIC FUNCTION PANEL
ALT: 23 U/L (ref 0–35)
AST: 26 U/L (ref 0–37)
Albumin: 4.1 g/dL (ref 3.5–5.2)
Alkaline Phosphatase: 59 U/L (ref 39–117)
Bilirubin, Direct: 0.1 mg/dL (ref 0.0–0.3)
Total Bilirubin: 0.6 mg/dL (ref 0.2–1.2)
Total Protein: 6.4 g/dL (ref 6.0–8.3)

## 2020-11-11 LAB — BASIC METABOLIC PANEL
BUN: 22 mg/dL (ref 6–23)
CO2: 30 mEq/L (ref 19–32)
Calcium: 9.2 mg/dL (ref 8.4–10.5)
Chloride: 104 mEq/L (ref 96–112)
Creatinine, Ser: 0.7 mg/dL (ref 0.40–1.20)
GFR: 83.96 mL/min (ref >60.00)
Glucose, Bld: 90 mg/dL (ref 70–99)
Potassium: 4.5 mEq/L (ref 3.5–5.1)
Sodium: 141 mEq/L (ref 135–145)

## 2020-11-11 LAB — CBC WITH DIFFERENTIAL/PLATELET
Basophils Absolute: 0.1 10*3/uL (ref 0.0–0.1)
Basophils Relative: 2 % (ref 0.0–3.0)
Eosinophils Absolute: 0.2 10*3/uL (ref 0.0–0.7)
Eosinophils Relative: 4.6 % (ref 0.0–5.0)
HCT: 44.6 % (ref 36.0–46.0)
Hemoglobin: 14.8 g/dL (ref 12.0–15.0)
Lymphocytes Relative: 36 % (ref 12.0–46.0)
Lymphs Abs: 1.6 10*3/uL (ref 0.7–4.0)
MCHC: 33.3 g/dL (ref 30.0–36.0)
MCV: 91.8 fl (ref 78.0–100.0)
Monocytes Absolute: 0.5 10*3/uL (ref 0.1–1.0)
Monocytes Relative: 11.2 % (ref 3.0–12.0)
Neutro Abs: 2.1 10*3/uL (ref 1.4–7.7)
Neutrophils Relative %: 46.2 % (ref 43.0–77.0)
Platelets: 223 10*3/uL (ref 150.0–400.0)
RBC: 4.86 Mil/uL (ref 3.87–5.11)
RDW: 12.5 % (ref 11.5–15.5)
WBC: 4.5 10*3/uL (ref 4.0–10.5)

## 2020-11-11 LAB — LIPID PANEL
Cholesterol: 196 mg/dL (ref 0–200)
HDL: 96.2 mg/dL (ref >39.00)
LDL Cholesterol: 86 mg/dL (ref 0–99)
NonHDL: 99.8
Total CHOL/HDL Ratio: 2
Triglycerides: 68 mg/dL (ref 0.0–149.0)
VLDL: 13.6 mg/dL (ref 0.0–40.0)

## 2020-11-11 LAB — T4, FREE: Free T4: 0.7 ng/dL (ref 0.60–1.60)

## 2020-11-11 LAB — TSH: TSH: 2.06 u[IU]/mL (ref 0.35–4.50)

## 2020-11-15 ENCOUNTER — Ambulatory Visit: Payer: Federal, State, Local not specified - PPO | Admitting: Internal Medicine

## 2020-11-17 ENCOUNTER — Encounter: Payer: Self-pay | Admitting: Internal Medicine

## 2020-11-17 ENCOUNTER — Other Ambulatory Visit: Payer: Self-pay

## 2020-11-17 ENCOUNTER — Ambulatory Visit: Payer: Federal, State, Local not specified - PPO | Admitting: Internal Medicine

## 2020-11-17 DIAGNOSIS — E559 Vitamin D deficiency, unspecified: Secondary | ICD-10-CM

## 2020-11-17 DIAGNOSIS — E7800 Pure hypercholesterolemia, unspecified: Secondary | ICD-10-CM | POA: Diagnosis not present

## 2020-11-17 DIAGNOSIS — Z1211 Encounter for screening for malignant neoplasm of colon: Secondary | ICD-10-CM | POA: Diagnosis not present

## 2020-11-17 NOTE — Progress Notes (Signed)
Patient ID: Vickie Crawford, female   DOB: April 12, 1946, 75 y.o.   MRN: [id number]   Subjective:    Patient ID: Vickie Crawford, female    DOB: Oct 15, 1945, 75 y.o.   MRN: [id number]  HPI This visit occurred during the SARS-CoV-2 public health emergency.  Safety protocols were in place, including screening questions prior to the visit, additional usage of staff PPE, and extensive cleaning of exam room while observing appropriate contact time as indicated for disinfecting solutions.  Patient here for a scheduled follow up. Here to follow up regarding her cholesterol.  She is doing well.  Feels good.  Stays active. No chest pain or sob reported.  No abdominal pain.  Bowels moving.  No cough or congestion. Discussed labs.  Doing well s/p cataract surgery.    Past Medical History:  Diagnosis Date  . Allergy   . Arthritis    fingers, toes  . Glaucoma   . Hypercholesterolemia   . Motion sickness    Rough ocean waters  . Osteopenia   . Ovarian cyst    Hospitalized 1969 and 1974 for removal  . Thyroid nodule   . Vitamin D deficiency    Past Surgical History:  Procedure Laterality Date  . CATARACT EXTRACTION W/PHACO Left 06/28/2020   Procedure: CATARACT EXTRACTION PHACO AND INTRAOCULAR LENS PLACEMENT (IOC) LEFT ISTENT INJ;  Surgeon: Nevada Crane, MD;  Location: [facility];  Service: Ophthalmology;  Laterality: Left;  7.96 0:52.3  . CATARACT EXTRACTION W/PHACO Right 08/02/2020   Procedure: CATARACT EXTRACTION PHACO AND INTRAOCULAR LENS PLACEMENT (IOC) RIGHT ISTENT INJ;  Surgeon: Nevada Crane, MD;  Location: [facility];  Service: Ophthalmology;  Laterality: Right;  1.84 0:20.0  . COLONOSCOPY W/ POLYPECTOMY    . COLONOSCOPY WITH PROPOFOL N/A 03/07/2017   Procedure: COLONOSCOPY WITH PROPOFOL;  Surgeon: Scot Jun, MD;  Location: [facility] ENDOSCOPY;  Service: Endoscopy;  Laterality: N/A;  . OVARIAN CYST REMOVAL     1969 and 1974   Family History  Problem Relation Age of  Onset  . Hypertension Mother   . Heart disease Mother   . Stroke Father   . Brain cancer Sister   . Breast cancer Neg Hx    Social History   Socioeconomic History  . Marital status: Married    Spouse name: Not on file  . Number of children: 2  . Years of education: Not on file  . Highest education level: Not on file  Occupational History  . Not on file  Tobacco Use  . Smoking status: Former Smoker    Types: Cigarettes    Quit date: 1995    Years since quitting: 27.2  . Smokeless tobacco: Never Used  Vaping Use  . Vaping Use: Never used  Substance and Sexual Activity  . Alcohol use: Yes    Alcohol/week: 2.0 standard drinks    Types: 2 Glasses of wine per week  . Drug use: No  . Sexual activity: Not on file  Other Topics Concern  . Not on file  Social History Narrative  . Not on file   Social Determinants of Health   Financial Resource Strain: Not on file  Food Insecurity: Not on file  Transportation Needs: Not on file  Physical Activity: Not on file  Stress: Not on file  Social Connections: Not on file    Outpatient Encounter Medications as of 11/17/2020  Medication Sig  . Calcium Carbonate-Vitamin D (CALTRATE 600+D PO) Take 1 tablet by mouth  2 (two) times daily.  . cholecalciferol (VITAMIN D) 1000 UNITS tablet Take 1,000 Units by mouth daily.  Marland Kitchen desonide (DESOWEN) 0.05 % ointment at bedtime.  . dorzolamide-timolol (COSOPT) 22.3-6.8 MG/ML ophthalmic solution   . fexofenadine (ALLEGRA) 180 MG tablet Take 180 mg by mouth daily.  . fluticasone (FLONASE) 50 MCG/ACT nasal spray PLACE 2 SPRAYS DAILY INTO BOTH NOSTRILS.  Marland Kitchen latanoprost (XALATAN) 0.005 % ophthalmic solution INSTILL ONE DROP IN BOTH EYES AT BEDTIME.  . meloxicam (MOBIC) 7.5 MG tablet TAKE 1 TABLET BY MOUTH EVERY DAY AS NEEDED  . pyridOXINE (VITAMIN B-6) 100 MG tablet Take 100 mg by mouth daily.  . simvastatin (ZOCOR) 20 MG tablet TAKE 1 TABLET (20 MG TOTAL) AT BEDTIME BY MOUTH.  . [DISCONTINUED]  amoxicillin (AMOXIL) 875 MG tablet Take 1 tablet (875 mg total) by mouth 2 (two) times daily.  . [DISCONTINUED] predniSONE (DELTASONE) 10 MG tablet Take 3 tablets (30 mg total) by mouth daily with breakfast.   No facility-administered encounter medications on file as of 11/17/2020.    Review of Systems  Constitutional: Negative for appetite change and unexpected weight change.  HENT: Negative for congestion and sinus pressure.   Respiratory: Negative for cough, chest tightness and shortness of breath.   Cardiovascular: Negative for chest pain, palpitations and leg swelling.  Gastrointestinal: Negative for abdominal pain, diarrhea, nausea and vomiting.  Genitourinary: Negative for difficulty urinating and dysuria.  Musculoskeletal: Negative for joint swelling and myalgias.  Skin: Negative for color change and rash.  Neurological: Negative for dizziness, light-headedness and headaches.  Psychiatric/Behavioral: Negative for agitation and dysphoric mood.       Objective:    Physical Exam Vitals reviewed.  Constitutional:      General: She is not in acute distress.    Appearance: Normal appearance.  HENT:     Head: Normocephalic and atraumatic.     Right Ear: External ear normal.     Left Ear: External ear normal.  Eyes:     General: No scleral icterus.       Right eye: No discharge.        Left eye: No discharge.     Conjunctiva/sclera: Conjunctivae normal.  Neck:     Thyroid: No thyromegaly.  Cardiovascular:     Rate and Rhythm: Normal rate and regular rhythm.  Pulmonary:     Effort: No respiratory distress.     Breath sounds: Normal breath sounds. No wheezing.  Abdominal:     General: Bowel sounds are normal.     Palpations: Abdomen is soft.     Tenderness: There is no abdominal tenderness.  Musculoskeletal:        General: No swelling or tenderness.     Cervical back: Neck supple. No tenderness.  Lymphadenopathy:     Cervical: No cervical adenopathy.  Skin:     Findings: No erythema or rash.  Neurological:     Mental Status: She is alert.  Psychiatric:        Mood and Affect: Mood normal.        Behavior: Behavior normal.     BP 134/82   Pulse 75   Temp (!) 97.4 F (36.3 C) (Oral)   Ht 5\' 1"  (1.549 m)   Wt 134 lb 12.8 oz (61.1 kg)   SpO2 92%   BMI 25.47 kg/m  Wt Readings from Last 3 Encounters:  11/17/20 134 lb 12.8 oz (61.1 kg)  08/02/20 133 lb 14.4 oz (60.7 kg)  06/28/20 134 lb (60.8  kg)     Lab Results  Component Value Date   WBC 4.5 11/11/2020   HGB 14.8 11/11/2020   HCT 44.6 11/11/2020   PLT 223.0 11/11/2020   GLUCOSE 90 11/11/2020   CHOL 196 11/11/2020   TRIG 68.0 11/11/2020   HDL 96.20 11/11/2020   LDLCALC 86 11/11/2020   ALT 23 11/11/2020   AST 26 11/11/2020   NA 141 11/11/2020   K 4.5 11/11/2020   CL 104 11/11/2020   CREATININE 0.70 11/11/2020   BUN 22 11/11/2020   CO2 30 11/11/2020   TSH 2.06 11/11/2020       Assessment & Plan:   Problem List Items Addressed This Visit    Colon cancer screening    Colonoscopy 02/2017.  Recommended f/u colonoscopy in 10 years.        Pure hypercholesterolemia    On simvastatin.  Low cholesterol diet and exercise.  Follow lipid panel and liver function tests.        Relevant Orders   Hepatic function panel   Lipid panel   Basic metabolic panel   Vitamin D deficiency    Last vitamin D level wnl.            Dale [place], MD

## 2020-11-20 ENCOUNTER — Encounter: Payer: Self-pay | Admitting: Internal Medicine

## 2020-11-20 NOTE — Assessment & Plan Note (Signed)
Colonoscopy 02/2017.  Recommended f/u colonoscopy in 10 years.   

## 2020-11-20 NOTE — Assessment & Plan Note (Signed)
On simvastatin.  Low cholesterol diet and exercise.  Follow lipid panel and liver function tests.   

## 2020-11-20 NOTE — Assessment & Plan Note (Signed)
Last vitamin D level wnl.  

## 2020-12-30 ENCOUNTER — Telehealth: Payer: Self-pay | Admitting: Internal Medicine

## 2020-12-30 ENCOUNTER — Encounter: Payer: Self-pay | Admitting: Adult Health

## 2020-12-30 ENCOUNTER — Telehealth (INDEPENDENT_AMBULATORY_CARE_PROVIDER_SITE_OTHER): Payer: Federal, State, Local not specified - PPO | Admitting: Adult Health

## 2020-12-30 VITALS — Temp 98.8°F | Ht 60.98 in | Wt 134.0 lb

## 2020-12-30 DIAGNOSIS — R0981 Nasal congestion: Secondary | ICD-10-CM | POA: Diagnosis not present

## 2020-12-30 DIAGNOSIS — U071 COVID-19: Secondary | ICD-10-CM | POA: Diagnosis not present

## 2020-12-30 NOTE — Telephone Encounter (Signed)
LMTCB

## 2020-12-30 NOTE — Telephone Encounter (Signed)
Pt scheduled for VV with michelle to discuss quarantine guidelines, etc. Pt confirmed she is feeling ok.

## 2020-12-30 NOTE — Progress Notes (Signed)
Virtual Visit via telephone  Note  I connected with Vickie Crawford on 12/30/20 at  4:30 PM EDT by a video enabled telemedicine application and verified that I am speaking with the correct person using two identifiers.this was a telephone visit patient could not connect to video.  Parties involved in visit as below:   Location: Patient: at home  Provider: Provider: Provider's office at  [facility], [place].      I discussed the limitations of evaluation and management by telemedicine and the availability of in person appointments. The patient expressed understanding and agreed to proceed.  History of Present Illness:   Patient is here for telephone visit could not get on video. She tested covid positive on 12/30/2020. She just returned from [place] and was negative on the cruise ship.  She has post nasal drainage cough,nasal congestion,  sore throat, also has cough. Onset of symptoms was 12/29/20. Mild fatigue.  Taking Robitussin and helping her.   Someone they were with tested positive and had to stay in [place].   Patient  denies any fever, body aches,chills, rash, chest pain, shortness of breath, nausea, vomiting, or diarrhea.  Denies dizziness, lightheadedness, pre syncopal or syncopal episodes.   Observations/Objective:   Patient is alert and oriented and responsive to questions Engages in conversation with provider. Speaks in full sentences without any pauses without any shortness of breath or distress.  Assessment and Plan:  1. Lab test positive for detection of COVID-19 virus  2. Nasal congestion She declined antibody infusion  evaluation.  Discussed symptomatic treatment, follow up if any symptoms changing or worsening at anytime.   Follow Up Instructions:   Return in about 4 days (around 01/03/2021), or if symptoms worsen or fail to improve, for at any time for any worsening symptoms, Go to Emergency room/ urgent care if worse.  Advised in  person evaluation at anytime is advised if any symptoms do not improve, worsen or change at any given time.  Red Flags discussed. The patient was given clear instructions to go to ER or return to medical center if any red flags develop, symptoms do not improve, worsen or new problems develop. They verbalized understanding.   I discussed the assessment and treatment plan with the patient. The patient was provided an opportunity to ask questions and all were answered. The patient agreed with the plan and demonstrated an understanding of the instructions.   The patient was advised to call back or seek an in-person evaluation if the symptoms worsen or if the condition fails to improve as anticipated.  I provided 25  minutes of non-face-to-face time during this encounter.   Jairo Ben, FNP

## 2020-12-30 NOTE — Telephone Encounter (Signed)
Patient called in wanted to know what she should do test positive for covid

## 2020-12-30 NOTE — Patient Instructions (Signed)

## 2021-01-07 ENCOUNTER — Encounter: Payer: Self-pay | Admitting: Internal Medicine

## 2021-01-07 ENCOUNTER — Other Ambulatory Visit: Payer: Self-pay

## 2021-01-07 ENCOUNTER — Telehealth (INDEPENDENT_AMBULATORY_CARE_PROVIDER_SITE_OTHER): Payer: Federal, State, Local not specified - PPO | Admitting: Internal Medicine

## 2021-01-07 ENCOUNTER — Telehealth: Payer: Self-pay | Admitting: Internal Medicine

## 2021-01-07 DIAGNOSIS — U071 COVID-19: Secondary | ICD-10-CM

## 2021-01-07 NOTE — Progress Notes (Signed)
Patient ID: Vickie Crawford, female   DOB: April 17, 1946, 75 y.o.   MRN: [id number]   Virtual Visit via video/telephone Note  This visit type was conducted due to national recommendations for restrictions regarding the COVID-19 pandemic (e.g. social distancing).  This format is felt to be most appropriate for this patient at this time.  All issues noted in this document were discussed and addressed.  No physical exam was performed (except for noted visual exam findings with Video Visits).   I connected with Vickie Crawford by a video enabled telemedicine application or telephone and verified that I am speaking with the correct person using two identifiers. Location patient: home Location provider: work Persons participating in the telephone visit: patient, provider  The limitations, risks, security and privacy concerns of performing an evaluation and management service by telephone and the availability of in person appointments have been discussed.  It has also been discussed with the patient that there may be a patient responsible charge related to this service. The patient expressed understanding and agreed to proceed.   Reason for visit: work in appt  HPI: Work in - covid positive.  [employer] [place] cruise recently.  Several people on ship tested positive.  Her symptoms started 12/29/20.  Tested positive 12/30/20.  Saw Michelle Flinchum 12/30/20.  Note reviewed.  Reports symptoms started with increased cough and drainage.  [place] and have [place] quarant[place].  Reports increase sinus pressure and previous colored mucus.  No sore throat.  Ears and head felt weird.  One day - temp - 99.3.  No loss of taste or smell.  Some congestion in back of throat.  Some fatigue.  No chest pain or chest tightness.  No sob.  No chest congestion.  Does feel better.  No nausea, vomiting or diarrhea.     ROS: See pertinent positives and negatives per HPI.  Past Medical History:  Diagnosis Date  . Allergy   .  Arthritis    fingers, toes  . Glaucoma   . Hypercholesterolemia   . Motion sickness    Rough ocean waters  . Osteopenia   . Ovarian cyst    Hospitalized 1969 and 1974 for removal  . Thyroid nodule   . Vitamin D deficiency     Past Surgical History:  Procedure Laterality Date  . CATARACT EXTRACTION W/PHACO Left 06/28/2020   Procedure: CATARACT EXTRACTION PHACO AND INTRAOCULAR LENS PLACEMENT (IOC) LEFT ISTENT INJ;  Surgeon: Nevada Crane, MD;  Location: [facility];  Service: Ophthalmology;  Laterality: Left;  7.96 0:52.3  . CATARACT EXTRACTION W/PHACO Right 08/02/2020   Procedure: CATARACT EXTRACTION PHACO AND INTRAOCULAR LENS PLACEMENT (IOC) RIGHT ISTENT INJ;  Surgeon: Nevada Crane, MD;  Location: [facility];  Service: Ophthalmology;  Laterality: Right;  1.84 0:20.0  . COLONOSCOPY W/ POLYPECTOMY    . COLONOSCOPY WITH PROPOFOL N/A 03/07/2017   Procedure: COLONOSCOPY WITH PROPOFOL;  Surgeon: Scot Jun, MD;  Location: [facility] END[facility];  Service: Endoscopy;  Laterality: N/A;  . OVARIAN CYST REMOVAL     1969 and 1974    Family History  Problem Relation Age of Onset  . Hypertension Mother   . Heart disease Mother   . Stroke Father   . Brain cancer Sister   . Breast cancer Neg Hx     SOCIAL HX: reviewed.    Current Outpatient Medications:  .  Calcium Carbonate-Vitamin D (CALTRATE 600+D PO), Take 1 tablet by mouth 2 (two) times daily., Disp: ,  Rfl:  .  cholecalciferol (VITAMIN D) 1000 UNITS tablet, Take 1,000 Units by mouth daily., Disp: , Rfl:  .  desonide (DESOWEN) 0.05 % ointment, at bedtime., Disp: , Rfl: 0 .  dorzolamide-timolol (COSOPT) 22.3-6.8 MG/ML ophthalmic solution, , Disp: , Rfl:  .  fexofenadine (ALLEGRA) 180 MG tablet, Take 180 mg by mouth daily., Disp: , Rfl:  .  fluticasone (FLONASE) 50 MCG/ACT nasal spray, PLACE 2 SPRAYS DAILY INTO BOTH NOSTRILS., Disp: 48 mL, Rfl: 1 .  latanoprost (XALATAN) 0.005 % ophthalmic solution, INSTILL  ONE DROP IN BOTH EYES AT BEDTIME., Disp: , Rfl: 5 .  meloxicam (MOBIC) 7.5 MG tablet, TAKE 1 TABLET BY MOUTH EVERY DAY AS NEEDED, Disp: 90 tablet, Rfl: 1 .  pyridOXINE (VITAMIN B-6) 100 MG tablet, Take 100 mg by mouth daily., Disp: , Rfl:  .  simvastatin (ZOCOR) 20 MG tablet, TAKE 1 TABLET (20 MG TOTAL) AT BEDTIME BY MOUTH., Disp: 90 tablet, Rfl: 1  EXAM:  GENERAL: alert. Answering questions appropriately.  Does not sound to be in any acute distress.    PSYCH/NEURO: pleasant and cooperative, no obvious depression or anxiety, speech and thought processing grossly intact  ASSESSMENT AND PLAN:  Discussed the following assessment and plan:  Problem List Items Addressed This Visit    COVID-19 virus infection    Tested positive 12/30/20.  She is feeling better.  Has been quarantined.  Discussed quarantine guidelines.  No chest pain, sob or chest tightness.  Eating.  Discussed robitussin DM and flonase and saline nasal spray as directed.   Follow.  Notify me if symptoms persist or any change in symptoms.           Return if symptoms worsen or fail to improve.   I discussed the assessment and treatment plan with the patient. The patient was provided an opportunity to ask questions and all were answered. The patient agreed with the plan and demonstrated an understanding of the instructions.   The patient was advised to call back or seek an in-person evaluation if the symptoms worsen or if the condition fails to improve as anticipated.  I provided 22 minutes of non-face-to-face time during this encounter.   Dale , MD

## 2021-01-07 NOTE — Telephone Encounter (Signed)
Confirmed with pt only symptom is lingering congestion and fatigue. Was seen by NP after testing positive for covid beginning of this month. Scheduled virtual with Dr Lorin Picket to discuss

## 2021-01-07 NOTE — Telephone Encounter (Signed)
PT called in wanting to advise that she tested positive for covid and would like to talk to someone to figure out what more she can do to get to feeling better. She can be reach at the # we have on file which is [phone].

## 2021-01-16 ENCOUNTER — Encounter: Payer: Self-pay | Admitting: Internal Medicine

## 2021-01-16 NOTE — Assessment & Plan Note (Signed)
Tested positive 12/30/20.  She is feeling better.  Has been quarantined.  Discussed quarantine guidelines.  No chest pain, sob or chest tightness.  Eating.  Discussed robitussin DM and flonase and saline nasal spray as directed.   Follow.  Notify me if symptoms persist or any change in symptoms.

## 2021-01-18 ENCOUNTER — Other Ambulatory Visit: Payer: Self-pay | Admitting: Internal Medicine

## 2021-01-18 DIAGNOSIS — Z1231 Encounter for screening mammogram for malignant neoplasm of breast: Secondary | ICD-10-CM

## 2021-01-19 ENCOUNTER — Other Ambulatory Visit: Payer: Self-pay | Admitting: Internal Medicine

## 2021-02-10 ENCOUNTER — Telehealth: Payer: Federal, State, Local not specified - PPO | Admitting: Internal Medicine

## 2021-02-11 ENCOUNTER — Other Ambulatory Visit: Payer: Self-pay | Admitting: Internal Medicine

## 2021-02-14 ENCOUNTER — Other Ambulatory Visit: Payer: Self-pay

## 2021-02-14 ENCOUNTER — Ambulatory Visit
Admission: RE | Admit: 2021-02-14 | Discharge: 2021-02-14 | Disposition: A | Discharge status: Home / Self Care | Payer: Federal, State, Local not specified - PPO | Source: Ambulatory Visit

## 2021-02-14 VITALS — BP 131/80 | HR 86 | Temp 98.9°F | Resp 18

## 2021-02-14 DIAGNOSIS — J0100 Acute maxillary sinusitis, unspecified: Secondary | ICD-10-CM

## 2021-02-14 MED ORDER — AZITHROMYCIN 250 MG PO TABS: 250.0000 mg | ORAL_TABLET | Freq: Every day | ORAL | 0 refills | Status: DC

## 2021-02-14 MED ORDER — BENZONATATE 100 MG PO CAPS: 100.0000 mg | ORAL_CAPSULE | Freq: Three times a day (TID) | ORAL | 0 refills | Status: DC | PRN

## 2021-02-14 NOTE — Discharge Instructions (Addendum)
Take the Zithromax and Tessalon as directed.    Follow up with your primary care provider if your symptoms are not improving.

## 2021-02-14 NOTE — ED Triage Notes (Signed)
Pt presents today with c/o of cough with some congestion x 1 week. Denies fever.

## 2021-02-14 NOTE — ED Provider Notes (Signed)
For [facility]    CSN: [id number] Arrival date & time: 02/14/21  1302      History   Chief Complaint Chief Complaint  Patient presents with   Cough    HPI Vickie Crawford is a 75 y.o. female.  Patient presents with congestion, postnasal drip, and cough for 1.5 weeks.  She states she feels short of breath when she has strong coughing episodes.  She denies fever, chills, rash, vomiting, diarrhea, or other symptoms.  Treatment attempted at home with NyQuil.  Her medical history includes COVID positive on 12/30/2020.     The history is provided by the patient and medical records.   Past Medical History:  Diagnosis Date   Allergy    Arthritis    fingers, toes   Glaucoma    Hypercholesterolemia    Motion sickness    Rough ocean waters   Osteopenia    Ovarian cyst    Hospitalized 1969 and 1974 for removal   Thyroid nodule    Vitamin D deficiency     Patient Active Problem List   Diagnosis Date Noted   COVID-19 virus infection 12/30/2020   Nasal congestion 12/30/2020   Glaucoma 10/29/2018   Elevated blood pressure reading 12/30/2017   Acute frontal sinusitis 08/16/2015   Colon cancer screening 07/04/2015   Health care maintenance 12/29/2014   Skin lesion 12/12/2013   Vitamin D deficiency 12/22/2012   Osteopenia 12/22/2012   URI (upper respiratory infection) 12/06/2012   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   Pure hypercholesterolemia 10/08/2012    Past Surgical History:  Procedure Laterality Date   CATARACT EXTRACTION W/PHACO Left 06/28/2020   Procedure: CATARACT EXTRACTION PHACO AND INTRAOCULAR LENS PLACEMENT (IOC) LEFT ISTENT INJ;  Surgeon: Nevada Crane, MD;  Location: [facility];  Service: Ophthalmology;  Laterality: Left;  7.96 0:52.3   CATARACT EXTRACTION W/PHACO Right 08/02/2020   Procedure: CATARACT EXTRACTION PHACO AND INTRAOCULAR LENS PLACEMENT (IOC) RIGHT ISTENT INJ;  Surgeon: Nevada Crane, MD;  Location: [facility];   Service: Ophthalmology;  Laterality: Right;  1.84 0:20.0   COLONOSCOPY W/ POLYPECTOMY     COLONOSCOPY WITH PROPOFOL N/A 03/07/2017   Procedure: COLONOSCOPY WITH PROPOFOL;  Surgeon: Scot Jun, MD;  Location: [facility] END[facility];  Service: Endoscopy;  Laterality: N/A;   OVARIAN CYST REMOVAL     1969 and 1974    OB History   No obstetric history on file.      Home Medications    Prior to Admission medications   Medication Sig Start Date End Date Taking? Authorizing Provider  azithromycin (ZITHROMAX) 250 MG tablet Take 1 tablet (250 mg total) by mouth daily. Take first 2 tablets together, then 1 every day until finished. 02/14/21  Yes Mickie Bail, NP  benzonatate (TESSALON) 100 MG capsule Take 1 capsule (100 mg total) by mouth 3 (three) times daily as needed for cough. 02/14/21  Yes Mickie Bail, NP  guaiFENesin-dextromethorphan (ROBITUSSIN DM) 100-10 MG/5ML syrup Take 5 mLs by mouth every 4 (four) hours as needed for cough.   Yes [provider]  Calcium Carbonate-Vitamin D (CALTRATE 600+D PO) Take 1 tablet by mouth 2 (two) times daily.    [provider]  cholecalciferol (VITAMIN D) 1000 UNITS tablet Take 1,000 Units by mouth daily.    [provider]  desonide (DESOWEN) 0.05 % ointment at bedtime. 10/29/14   [provider]  dorzolamide-timolol (COSOPT) 22.3-6.8 MG/ML ophthalmic solution  09/03/18   [provider]  fexofenadine (ALLEGRA) 180 MG tablet Take 180 mg by mouth daily.    [provider]  fluticasone (FLONASE) 50 MCG/ACT nasal spray PLACE 2 SPRAYS DAILY INTO BOTH NOSTRILS. 09/06/20   Dale Pendleton, MD  latanoprost (XALATAN) 0.005 % ophthalmic solution INSTILL ONE DROP IN BOTH EYES AT BEDTIME. 06/18/15   [provider]  meloxicam (MOBIC) 7.5 MG tablet TAKE 1 TABLET BY MOUTH EVERY DAY AS NEEDED 01/19/21   Dale Galatia, MD  pyridOXINE (VITAMIN B-6) 100 MG tablet Take 100 mg by mouth daily.    [provider]  simvastatin (ZOCOR) 20 MG tablet TAKE 1 TABLET (20 MG TOTAL) AT BEDTIME BY MOUTH. 02/11/21   Dale Tremont, MD    Family History Family History  Problem Relation Age of Onset   Hypertension Mother    Heart disease Mother    Stroke Father    Brain cancer Sister    Breast cancer Neg Hx     Social History Social History   Tobacco Use   Smoking status: Former    Pack years: 0.00    Types: Cigarettes    Quit date: 1995    Years since quitting: 27.4   Smokeless tobacco: Never  Vaping Use   Vaping Use: Never used  Substance Use Topics   Alcohol use: Yes    Alcohol/week: 2.0 standard drinks    Types: 2 Glasses of wine per week   Drug use: No     Allergies   Patient has no known allergies.   Review of Systems Review of Systems  Constitutional:  Negative for chills and fever.  HENT:  Positive for congestion and postnasal drip. Negative for ear pain and sore throat.   Respiratory:  Positive for cough and shortness of breath.   Cardiovascular:  Negative for chest pain and palpitations.  Gastrointestinal:  Negative for abdominal pain, diarrhea and vomiting.  Skin:  Negative for color change and rash.  All other systems reviewed and are negative.   Physical Exam Triage Vital Signs ED Triage Vitals  Enc Vitals Group     BP      Pulse      Resp      Temp      Temp src      SpO2      Weight      Height      Head Circumference      Peak Flow      Pain Score      Pain Loc      Pain Edu?      Excl. in GC?    No data found.  Updated Vital Signs BP 131/80 (BP Location: Left Arm)   Pulse 86   Temp 98.9 F (37.2 C) (Oral)   Resp 18   SpO2 93%   Visual Acuity Right Eye Distance:   Left Eye Distance:   Bilateral Distance:    Right Eye Near:   Left Eye Near:    Bilateral Near:     Physical Exam Vitals and nursing note reviewed.  Constitutional:      General: She is not in acute distress.    Appearance: She is well-developed. She is not  ill-appearing.  HENT:     Head: Normocephalic and atraumatic.     Right Ear: Tympanic membrane normal.     Left Ear: Tympanic membrane normal.     Nose: Congestion and rhinorrhea present.     Mouth/Throat:     Mouth: Mucous membranes are  moist.     Pharynx: Oropharynx is clear.  Eyes:     Conjunctiva/sclera: Conjunctivae normal.  Cardiovascular:     Rate and Rhythm: Normal rate and regular rhythm.     Heart sounds: Normal heart sounds.  Pulmonary:     Effort: Pulmonary effort is normal. No respiratory distress.     Breath sounds: Normal breath sounds. No wheezing or rhonchi.  Abdominal:     Palpations: Abdomen is soft.     Tenderness: There is no abdominal tenderness.  Musculoskeletal:     Cervical back: Neck supple.  Skin:    General: Skin is warm and dry.  Neurological:     General: No focal deficit present.     Mental Status: She is alert and oriented to person, place, and time.     Gait: Gait normal.  Psychiatric:        Mood and Affect: Mood normal.        Behavior: Behavior normal.     UC Treatments / Results  Labs (all labs ordered are listed, but only abnormal results are displayed) Labs Reviewed - No data to display  EKG   Radiology No results found.  Procedures Procedures (including critical care time)  Medications Ordered in UC Medications - No data to display  Initial Impression / Assessment and Plan / UC Course  I have reviewed the triage vital signs and the nursing notes.  Pertinent labs & imaging results that were available during my care of the patient were reviewed by me and considered in my medical decision making (see chart for details).  Acute sinusitis.  Treating with Zithromax and Tessalon Perles.  No COVID test indicated today as patient was positive for COVID last month.  Instructed her to follow-up with her PCP if her symptoms are not improving.  She agrees to plan of care.   Final Clinical Impressions(s) / UC Diagnoses   Final  diagnoses:  Acute non-recurrent maxillary sinusitis     Discharge Instructions      Take the Zithromax and Tessalon as directed.    Follow up with your primary care provider if your symptoms are not improving.         ED Prescriptions     Medication Sig Dispense Auth. Provider   azithromycin (ZITHROMAX) 250 MG tablet Take 1 tablet (250 mg total) by mouth daily. Take first 2 tablets together, then 1 every day until finished. 6 tablet Mickie Bail, NP   benzonatate (TESSALON) 100 MG capsule Take 1 capsule (100 mg total) by mouth 3 (three) times daily as needed for cough. 21 capsule Mickie Bail, NP      PDMP not reviewed this encounter.   Mickie Bail, NP 02/14/21 1346

## 2021-02-16 ENCOUNTER — Ambulatory Visit
Admission: RE | Admit: 2021-02-16 | Discharge: 2021-02-16 | Disposition: A | Discharge status: Home / Self Care | Payer: Federal, State, Local not specified - PPO | Source: Ambulatory Visit | Attending: Internal Medicine | Admitting: Internal Medicine

## 2021-02-16 ENCOUNTER — Other Ambulatory Visit: Payer: Self-pay

## 2021-02-16 DIAGNOSIS — Z1231 Encounter for screening mammogram for malignant neoplasm of breast: Secondary | ICD-10-CM | POA: Insufficient documentation

## 2021-05-18 ENCOUNTER — Other Ambulatory Visit: Payer: Self-pay

## 2021-05-18 ENCOUNTER — Other Ambulatory Visit (INDEPENDENT_AMBULATORY_CARE_PROVIDER_SITE_OTHER): Payer: Federal, State, Local not specified - PPO

## 2021-05-18 DIAGNOSIS — E7800 Pure hypercholesterolemia, unspecified: Secondary | ICD-10-CM

## 2021-05-18 LAB — LIPID PANEL
Cholesterol: 187 mg/dL (ref 0–200)
HDL: 89.6 mg/dL (ref >39.00)
LDL Cholesterol: 86 mg/dL (ref 0–99)
NonHDL: 97.38
Total CHOL/HDL Ratio: 2
Triglycerides: 57 mg/dL (ref 0.0–149.0)
VLDL: 11.4 mg/dL (ref 0.0–40.0)

## 2021-05-18 LAB — HEPATIC FUNCTION PANEL
ALT: 24 U/L (ref 0–35)
AST: 26 U/L (ref 0–37)
Albumin: 4 g/dL (ref 3.5–5.2)
Alkaline Phosphatase: 57 U/L (ref 39–117)
Bilirubin, Direct: 0.1 mg/dL (ref 0.0–0.3)
Total Bilirubin: 0.6 mg/dL (ref 0.2–1.2)
Total Protein: 6.7 g/dL (ref 6.0–8.3)

## 2021-05-18 LAB — BASIC METABOLIC PANEL
BUN: 22 mg/dL (ref 6–23)
CO2: 29 mEq/L (ref 19–32)
Calcium: 9.1 mg/dL (ref 8.4–10.5)
Chloride: 105 mEq/L (ref 96–112)
Creatinine, Ser: 0.73 mg/dL (ref 0.40–1.20)
GFR: 79.55 mL/min (ref >60.00)
Glucose, Bld: 89 mg/dL (ref 70–99)
Potassium: 4 mEq/L (ref 3.5–5.1)
Sodium: 140 mEq/L (ref 135–145)

## 2021-05-20 ENCOUNTER — Ambulatory Visit (INDEPENDENT_AMBULATORY_CARE_PROVIDER_SITE_OTHER): Payer: Federal, State, Local not specified - PPO | Admitting: Internal Medicine

## 2021-05-20 ENCOUNTER — Other Ambulatory Visit: Payer: Self-pay

## 2021-05-20 VITALS — BP 136/78 | HR 73 | Temp 97.9°F | Resp 16 | Ht 60.0 in | Wt 133.0 lb

## 2021-05-20 DIAGNOSIS — Z23 Encounter for immunization: Secondary | ICD-10-CM

## 2021-05-20 DIAGNOSIS — E7800 Pure hypercholesterolemia, unspecified: Secondary | ICD-10-CM | POA: Diagnosis not present

## 2021-05-20 DIAGNOSIS — Z0000 Encounter for general adult medical examination without abnormal findings: Secondary | ICD-10-CM

## 2021-05-20 NOTE — Progress Notes (Signed)
Patient ID: Vickie Crawford, female   DOB: 13-Apr-1946, 75 y.o.   MRN: [id number]   Subjective:    Patient ID: Vickie Crawford, female    DOB: Jan 03, 1946, 75 y.o.   MRN: [id number]  This visit occurred during the SARS-CoV-2 public health emergency.  Safety protocols were in place, including screening questions prior to the visit, additional usage of staff PPE, and extensive cleaning of exam room while observing appropriate contact time as indicated for disinfecting solutions.   Patient here for her physical exam.   Chief Complaint  Patient presents with   Annual Exam   .   HPI She is doing well.  Had covid 12/30/20.  Treated for sinus infection 02/14/21 - zpak/tessalon perles.  Better now.  No residual congestion or cough.  No chest pain or sob.  No acid reflux.  No abdominal pain.  Bowels moving.     Past Medical History:  Diagnosis Date   Allergy    Arthritis    fingers, toes   Glaucoma    Hypercholesterolemia    Motion sickness    Rough ocean waters   Osteopenia    Ovarian cyst    Hospitalized 1969 and 1974 for removal   Thyroid nodule    Vitamin D deficiency    Past Surgical History:  Procedure Laterality Date   CATARACT EXTRACTION W/PHACO Left 06/28/2020   Procedure: CATARACT EXTRACTION PHACO AND INTRAOCULAR LENS PLACEMENT (IOC) LEFT ISTENT INJ;  Surgeon: Nevada Crane, MD;  Location: [facility];  Service: Ophthalmology;  Laterality: Left;  7.96 0:52.3   CATARACT EXTRACTION W/PHACO Right 08/02/2020   Procedure: CATARACT EXTRACTION PHACO AND INTRAOCULAR LENS PLACEMENT (IOC) RIGHT ISTENT INJ;  Surgeon: Nevada Crane, MD;  Location: [facility];  Service: Ophthalmology;  Laterality: Right;  1.84 0:20.0   COLONOSCOPY W/ POLYPECTOMY     COLONOSCOPY WITH PROPOFOL N/A 03/07/2017   Procedure: COLONOSCOPY WITH PROPOFOL;  Surgeon: Scot Jun, MD;  Location: [facility];  Service: Endoscopy;  Laterality: N/A;   OVARIAN CYST REMOVAL     1969 and 1974    Family History  Problem Relation Age of Onset   Hypertension Mother    Heart disease Mother    Stroke Father    Brain cancer Sister    Breast cancer Neg Hx    Social History   Socioeconomic History   Marital status: Married    Spouse name: Not on file   Number of children: 2   Years of education: Not on file   Highest education level: Not on file  Occupational History   Not on file  Tobacco Use   Smoking status: Former    Types: Cigarettes    Quit date: 1995    Years since quitting: 27.7   Smokeless tobacco: Never  Vaping Use   Vaping Use: Never used  Substance and Sexual Activity   Alcohol use: Yes    Alcohol/week: 2.0 standard drinks    Types: 2 Glasses of wine per week   Drug use: No   Sexual activity: Not on file  Other Topics Concern   Not on file  Social History Narrative   Not on file   Social Determinants of Health   Financial Resource Strain: Not on file  Food Insecurity: Not on file  Transportation Needs: Not on file  Physical Activity: Not on file  Stress: Not on file  Social Connections: Not on file     Review of Systems  Constitutional:  Negative for appetite change and unexpected weight change.  HENT:  Negative for congestion, sinus pressure and sore throat.   Eyes:  Negative for pain and visual disturbance.  Respiratory:  Negative for cough, chest tightness and shortness of breath.   Cardiovascular:  Negative for chest pain, palpitations and leg swelling.  Gastrointestinal:  Negative for abdominal pain, diarrhea, nausea and vomiting.  Genitourinary:  Negative for difficulty urinating and dysuria.  Musculoskeletal:  Negative for joint swelling and myalgias.  Skin:  Negative for color change and rash.  Neurological:  Negative for dizziness, light-headedness and headaches.  Hematological:  Negative for adenopathy. Does not bruise/bleed easily.  Psychiatric/Behavioral:  Negative for agitation and dysphoric mood.       Objective:     BP  136/78   Pulse 73   Temp 97.9 F (36.6 C)   Resp 16   Ht 5' (1.524 m)   Wt 133 lb (60.3 kg)   SpO2 97%   BMI 25.97 kg/m  Wt Readings from Last 3 Encounters:  05/20/21 133 lb (60.3 kg)  01/07/21 134 lb (60.8 kg)  12/30/20 134 lb (60.8 kg)    Physical Exam Vitals reviewed.  Constitutional:      General: She is not in acute distress.    Appearance: Normal appearance. She is well-developed.  HENT:     Head: Normocephalic and atraumatic.     Right Ear: External ear normal.     Left Ear: External ear normal.  Eyes:     General: No scleral icterus.       Right eye: No discharge.        Left eye: No discharge.     Conjunctiva/sclera: Conjunctivae normal.  Neck:     Thyroid: No thyromegaly.  Cardiovascular:     Rate and Rhythm: Normal rate and regular rhythm.  Pulmonary:     Effort: No tachypnea, accessory muscle usage or respiratory distress.     Breath sounds: Normal breath sounds. No decreased breath sounds or wheezing.  Chest:  Breasts:    Right: No inverted nipple, mass, nipple discharge or tenderness (no axillary adenopathy).     Left: No inverted nipple, mass, nipple discharge or tenderness (no axilarry adenopathy).  Abdominal:     General: Bowel sounds are normal.     Palpations: Abdomen is soft.     Tenderness: There is no abdominal tenderness.  Musculoskeletal:        General: No swelling or tenderness.     Cervical back: Neck supple.  Lymphadenopathy:     Cervical: No cervical adenopathy.  Skin:    Findings: No erythema or rash.  Neurological:     Mental Status: She is alert and oriented to person, place, and time.  Psychiatric:        Mood and Affect: Mood normal.        Behavior: Behavior normal.     Outpatient Encounter Medications as of 05/20/2021  Medication Sig   Calcium Carbonate-Vitamin D (CALTRATE 600+D PO) Take 1 tablet by mouth 2 (two) times daily.   cholecalciferol (VITAMIN D) 1000 UNITS tablet Take 1,000 Units by mouth daily.   desonide  (DESOWEN) 0.05 % ointment at bedtime.   dorzolamide-timolol (COSOPT) 22.3-6.8 MG/ML ophthalmic solution    fexofenadine (ALLEGRA) 180 MG tablet Take 180 mg by mouth daily.   fluticasone (FLONASE) 50 MCG/ACT nasal spray PLACE 2 SPRAYS DAILY INTO BOTH NOSTRILS.   latanoprost (XALATAN) 0.005 % ophthalmic solution INSTILL ONE DROP IN BOTH EYES AT BEDTIME.  meloxicam (MOBIC) 7.5 MG tablet TAKE 1 TABLET BY MOUTH EVERY DAY AS NEEDED   pyridOXINE (VITAMIN B-6) 100 MG tablet Take 100 mg by mouth daily.   simvastatin (ZOCOR) 20 MG tablet TAKE 1 TABLET (20 MG TOTAL) AT BEDTIME BY MOUTH.   [DISCONTINUED] azithromycin (ZITHROMAX) 250 MG tablet Take 1 tablet (250 mg total) by mouth daily. Take first 2 tablets together, then 1 every day until finished.   [DISCONTINUED] benzonatate (TESSALON) 100 MG capsule Take 1 capsule (100 mg total) by mouth 3 (three) times daily as needed for cough.   [DISCONTINUED] guaiFENesin-dextromethorphan (ROBITUSSIN DM) 100-10 MG/5ML syrup Take 5 mLs by mouth every 4 (four) hours as needed for cough.   No facility-administered encounter medications on file as of 05/20/2021.     Lab Results  Component Value Date   WBC 4.5 11/11/2020   HGB 14.8 11/11/2020   HCT 44.6 11/11/2020   PLT 223.0 11/11/2020   GLUCOSE 89 05/18/2021   CHOL 187 05/18/2021   TRIG 57.0 05/18/2021   HDL 89.60 05/18/2021   LDLCALC 86 05/18/2021   ALT 24 05/18/2021   AST 26 05/18/2021   NA 140 05/18/2021   K 4.0 05/18/2021   CL 105 05/18/2021   CREATININE 0.73 05/18/2021   BUN 22 05/18/2021   CO2 29 05/18/2021   TSH 2.06 11/11/2020    MM 3D SCREEN BREAST BILATERAL  Result Date: 02/17/2021 CLINICAL DATA:  Screening. EXAM: DIGITAL SCREENING BILATERAL MAMMOGRAM WITH TOMOSYNTHESIS AND CAD TECHNIQUE: Bilateral screening digital craniocaudal and mediolateral oblique mammograms were obtained. Bilateral screening digital breast tomosynthesis was performed. The images were evaluated with computer-aided  detection. COMPARISON:  Previous exam(s). ACR Breast Density Category a: The breast tissue is almost entirely fatty. FINDINGS: There are no findings suspicious for malignancy. IMPRESSION: No mammographic evidence of malignancy. A result letter of this screening mammogram will be mailed directly to the patient. RECOMMENDATION: Screening mammogram in one year. (Code:SM-B-01Y) BI-RADS CATEGORY  1: Negative. Electronically Signed   By: Meda Klinefelter MD   On: 02/17/2021 12:31      Assessment & Plan:   Problem List Items Addressed This Visit     Health care maintenance    Physical today 05/20/21.  Colonoscopy 02/2017- recommended f/u in 10 years.  Mammogram 02/17/21 - Birads I.        Pure hypercholesterolemia    On simvastatin.  Low cholesterol diet and exercise.  Follow lipid panel and liver function tests.        Other Visit Diagnoses     Routine general medical examination at a health care facility    -  Primary   Need for immunization against influenza       Relevant Orders   Flu Vaccine QUAD High Dose(Fluad) (Completed)        Dale [place], MD

## 2021-05-20 NOTE — Assessment & Plan Note (Signed)
Physical today 05/20/21.  Colonoscopy 02/2017- recommended f/u in 10 years.  Mammogram 02/17/21 - Birads I.

## 2021-05-28 ENCOUNTER — Encounter: Payer: Self-pay | Admitting: Internal Medicine

## 2021-05-28 NOTE — Assessment & Plan Note (Signed)
On simvastatin.  Low cholesterol diet and exercise.  Follow lipid panel and liver function tests.   

## 2021-07-15 ENCOUNTER — Other Ambulatory Visit: Payer: Self-pay | Admitting: Internal Medicine

## 2021-08-18 ENCOUNTER — Ambulatory Visit: Payer: Federal, State, Local not specified - PPO | Admitting: Internal Medicine

## 2021-10-06 ENCOUNTER — Other Ambulatory Visit: Payer: Self-pay

## 2021-10-06 ENCOUNTER — Ambulatory Visit
Admission: RE | Admit: 2021-10-06 | Discharge: 2021-10-06 | Disposition: A | Discharge status: Home / Self Care | Payer: Federal, State, Local not specified - PPO | Source: Ambulatory Visit | Attending: Physician Assistant | Admitting: Physician Assistant

## 2021-10-06 VITALS — BP 126/83 | HR 82 | Temp 98.7°F | Resp 18

## 2021-10-06 DIAGNOSIS — J0120 Acute ethmoidal sinusitis, unspecified: Secondary | ICD-10-CM

## 2021-10-06 MED ORDER — BENZONATATE 100 MG PO CAPS: 100.0000 mg | ORAL_CAPSULE | Freq: Four times a day (QID) | ORAL | 0 refills | Status: DC | PRN

## 2021-10-06 MED ORDER — AMOXICILLIN-POT CLAVULANATE 875-125 MG PO TABS: 1.0000 | ORAL_TABLET | Freq: Two times a day (BID) | ORAL | 0 refills | Status: AC

## 2021-10-06 NOTE — ED Triage Notes (Signed)
Pt c/o cough and chest congestion x1 week.  

## 2021-10-06 NOTE — ED Provider Notes (Signed)
[facility]    CSN: [id number] Arrival date & time: 10/06/21  1314      History   Chief Complaint Chief Complaint  Patient presents with   Cough    HPI Vickie Crawford is a 76 y.o. female.   Pt concerned that she has a sinus infection.  Pt reports husband has the same.  Pt reports husband better after taking antibiotics.  Pt complains of facila pressure   The history is provided by the patient. No language interpreter was used.  Cough Cough characteristics:  Productive Sputum characteristics:  Nondescript Severity:  Moderate Onset quality:  Gradual Duration:  1 week Timing:  Constant Progression:  Worsening Relieved by:  Nothing Worsened by:  Nothing Ineffective treatments:  None tried  Past Medical History:  Diagnosis Date   Allergy    Arthritis    fingers, toes   Glaucoma    Hypercholesterolemia    Motion sickness    Rough ocean waters   Osteopenia    Ovarian cyst    Hospitalized 1969 and 1974 for removal   Thyroid nodule    Vitamin D deficiency     Patient Active Problem List   Diagnosis Date Noted   COVID-19 virus infection 12/30/2020   Nasal congestion 12/30/2020   Glaucoma 10/29/2018   Elevated blood pressure reading 12/30/2017   Acute frontal sinusitis 08/16/2015   Colon cancer screening 07/04/2015   Health care maintenance 12/29/2014   Skin lesion 12/12/2013   Vitamin D deficiency 12/22/2012   Osteopenia 12/22/2012   URI (upper respiratory infection) 12/06/2012   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   Pure hypercholesterolemia 10/08/2012    Past Surgical History:  Procedure Laterality Date   CATARACT EXTRACTION W/PHACO Left 06/28/2020   Procedure: CATARACT EXTRACTION PHACO AND INTRAOCULAR LENS PLACEMENT ([place]) LEFT ISTENT INJ;  Surgeon: Eulogio Bear, MD;  Location: [facility];  Service: Ophthalmology;  Laterality: Left;  7.96 0:52.3   CATARACT EXTRACTION W/PHACO Right 08/02/2020   Procedure: CATARACT EXTRACTION PHACO  AND INTRAOCULAR LENS PLACEMENT ([place]) RIGHT ISTENT INJ;  Surgeon: Eulogio Bear, MD;  Location: [place];  Service: Ophthalmology;  Laterality: Right;  1.84 0:20.0   COLONOSCOPY W/ POLYPECTOMY     COLONOSCOPY WITH PROPOFOL N/A 03/07/2017   Procedure: COLONOSCOPY WITH PROPOFOL;  Surgeon: Manya Silvas, MD;  Location[facility];  Service: Endoscopy;  Laterality: N/A;   OVARIAN CYST REMOVAL     1969 and 1974    OB History   No obstetric history on file.      Home Medications    Prior to Admission medications   Medication Sig Start Date End Date Taking? Authorizing Provider  amoxicillin-clavulanate (AUGMENTIN) 875-125 MG tablet Take 1 tablet by mouth 2 (two) times daily for 10 days. 10/06/21 10/16/21 Yes Caryl Ada K, PA-C  benzonatate (TESSALON PERLES) 100 MG capsule Take 1 capsule (100 mg total) by mouth every 6 (six) hours as needed for cough. 10/06/21 10/06/22 Yes Fransico Meadow, PA-C  Calcium Carbonate-Vitamin D (CALTRATE 600+D PO) Take 1 tablet by mouth 2 (two) times daily.    [provider]  cholecalciferol (VITAMIN D) 1000 UNITS tablet Take 1,000 Units by mouth daily.    [provider]  desonide (DESOWEN) 0.05 % ointment at bedtime. 10/29/14   [provider]  dorzolamide-timolol (COSOPT) 22.3-6.8 MG/ML ophthalmic solution  09/03/18   [provider]  fexofenadine (ALLEGRA) 180 MG tablet Take 180 mg by mouth daily.    [provider]  fluticasone (FLONASE) 50 MCG/ACT nasal spray PLACE 2 SPRAYS DAILY INTO BOTH NOSTRILS. 09/06/20   Einar Pheasant, MD  latanoprost (XALATAN) 0.005 % ophthalmic solution INSTILL ONE DROP IN BOTH EYES AT BEDTIME. 06/18/15   [provider]  meloxicam (MOBIC) 7.5 MG tablet TAKE 1 TABLET BY MOUTH EVERY DAY AS NEEDED 07/15/21   Einar Pheasant, MD  pyridOXINE (VITAMIN B-6) 100 MG tablet Take 100 mg by mouth daily.    [provider]  simvastatin (ZOCOR) 20 MG tablet TAKE 1 TABLET  (20 MG TOTAL) AT BEDTIME BY MOUTH. 07/15/21   Einar Pheasant, MD    Family History Family History  Problem Relation Age of Onset   Hypertension Mother    Heart disease Mother    Stroke Father    Brain cancer Sister    Breast cancer Neg Hx     Social History Social History   Tobacco Use   Smoking status: Former    Types: Cigarettes    Quit date: 1995    Years since quitting: 28.1   Smokeless tobacco: Never  Vaping Use   Vaping Use: Never used  Substance Use Topics   Alcohol use: Yes    Alcohol/week: 2.0 standard drinks    Types: 2 Glasses of wine per week   Drug use: No     Allergies   Patient has no known allergies.   Review of Systems Review of Systems  Respiratory:  Positive for cough.   All other systems reviewed and are negative.   Physical Exam Triage Vital Signs ED Triage Vitals  Enc Vitals Group     BP 10/06/21 1339 126/83     Pulse Rate 10/06/21 1339 82     Resp 10/06/21 1339 18     Temp 10/06/21 1339 98.7 F (37.1 C)     Temp Source 10/06/21 1339 Oral     SpO2 10/06/21 1339 93 %     Weight --      Height --      Head Circumference --      Peak Flow --      Pain Score 10/06/21 1350 0     Pain Loc --      Pain Edu? --      Excl. in [place]? --    No data found.  Updated Vital Signs BP 126/83 (BP Location: Left Arm)    Pulse 82    Temp 98.7 F (37.1 C) (Oral)    Resp 18    SpO2 93%   Visual Acuity Right Eye Distance:   Left Eye Distance:   Bilateral Distance:    Right Eye Near:   Left Eye Near:    Bilateral Near:     Physical Exam Vitals and nursing note reviewed.  Constitutional:      Appearance: She is well-developed.  HENT:     Head: Normocephalic.     Nose: Nose normal.     Mouth/Throat:     Mouth: Mucous membranes are moist.  Eyes:     Extraocular Movements: Extraocular movements intact.  Pulmonary:     Effort: Pulmonary effort is normal.  Abdominal:     General: Abdomen is flat. There is no distension.   Musculoskeletal:        General: Normal range of motion.     Cervical back: Normal range of motion.  Skin:    General: Skin is warm.  Neurological:     General: No focal deficit present.  Mental Status: She is alert and oriented to person, place, and time.  Psychiatric:        Mood and Affect: Mood normal.     UC Treatments / Results  Labs (all labs ordered are listed, but only abnormal results are displayed) Labs Reviewed - No data to display  EKG   Radiology No results found.  Procedures Procedures (including critical care time)  Medications Ordered in UC Medications - No data to display  Initial Impression / Assessment and Plan / UC Course  I have reviewed the triage vital signs and the nursing notes.  Pertinent labs & imaging results that were available during my care of the patient were reviewed by me and considered in my medical decision making (see chart for details).     MDM:  Pt given rx for augmentin.  Pt requesting RX for tussionex.  (Pt given rx for tessalon)  Final Clinical Impressions(s) / UC Diagnoses   Final diagnoses:  Acute ethmoidal sinusitis, recurrence not specified   Discharge Instructions   None    ED Prescriptions     Medication Sig Dispense Auth. Provider   amoxicillin-clavulanate (AUGMENTIN) 875-125 MG tablet Take 1 tablet by mouth 2 (two) times daily for 10 days. 20 tablet Deondrea Markos K, PA-C   benzonatate (TESSALON PERLES) 100 MG capsule Take 1 capsule (100 mg total) by mouth every 6 (six) hours as needed for cough. 30 capsule Fransico Meadow, [place]      PDMP not reviewed this encounter.   Fransico Meadow, [place] 10/06/21 1356

## 2021-10-19 ENCOUNTER — Other Ambulatory Visit: Payer: Self-pay

## 2021-10-19 ENCOUNTER — Telehealth: Payer: Self-pay | Admitting: Internal Medicine

## 2021-10-19 ENCOUNTER — Encounter: Payer: Self-pay | Admitting: Internal Medicine

## 2021-10-19 ENCOUNTER — Ambulatory Visit: Payer: Federal, State, Local not specified - PPO | Admitting: Internal Medicine

## 2021-10-19 VITALS — BP 128/76 | HR 69 | Temp 97.9°F | Resp 16 | Ht 61.0 in | Wt 134.0 lb

## 2021-10-19 DIAGNOSIS — Z1211 Encounter for screening for malignant neoplasm of colon: Secondary | ICD-10-CM | POA: Diagnosis not present

## 2021-10-19 DIAGNOSIS — R030 Elevated blood-pressure reading, without diagnosis of hypertension: Secondary | ICD-10-CM | POA: Diagnosis not present

## 2021-10-19 DIAGNOSIS — M8580 Other specified disorders of bone density and structure, unspecified site: Secondary | ICD-10-CM | POA: Diagnosis not present

## 2021-10-19 DIAGNOSIS — E7800 Pure hypercholesterolemia, unspecified: Secondary | ICD-10-CM

## 2021-10-19 LAB — BASIC METABOLIC PANEL
BUN: 21 mg/dL (ref 6–23)
CO2: 33 mEq/L — ABNORMAL HIGH (ref 19–32)
Calcium: 9.4 mg/dL (ref 8.4–10.5)
Chloride: 104 mEq/L (ref 96–112)
Creatinine, Ser: 0.75 mg/dL (ref 0.40–1.20)
GFR: 76.78 mL/min (ref >60.00)
Glucose, Bld: 94 mg/dL (ref 70–99)
Potassium: 4.6 mEq/L (ref 3.5–5.1)
Sodium: 141 mEq/L (ref 135–145)

## 2021-10-19 LAB — LIPID PANEL
Cholesterol: 203 mg/dL — ABNORMAL HIGH (ref 0–200)
HDL: 89.9 mg/dL (ref >39.00)
LDL Cholesterol: 102 mg/dL — ABNORMAL HIGH (ref 0–99)
NonHDL: 113.08
Total CHOL/HDL Ratio: 2
Triglycerides: 56 mg/dL (ref 0.0–149.0)
VLDL: 11.2 mg/dL (ref 0.0–40.0)

## 2021-10-19 LAB — CBC WITH DIFFERENTIAL/PLATELET
Basophils Absolute: 0.1 10*3/uL (ref 0.0–0.1)
Basophils Relative: 2.6 % (ref 0.0–3.0)
Eosinophils Absolute: 0.2 10*3/uL (ref 0.0–0.7)
Eosinophils Relative: 3.8 % (ref 0.0–5.0)
HCT: 43.3 % (ref 36.0–46.0)
Hemoglobin: 14.3 g/dL (ref 12.0–15.0)
Lymphocytes Relative: 26.3 % (ref 12.0–46.0)
Lymphs Abs: 1.2 10*3/uL (ref 0.7–4.0)
MCHC: 33 g/dL (ref 30.0–36.0)
MCV: 91.4 fl (ref 78.0–100.0)
Monocytes Absolute: 0.4 10*3/uL (ref 0.1–1.0)
Monocytes Relative: 9 % (ref 3.0–12.0)
Neutro Abs: 2.7 10*3/uL (ref 1.4–7.7)
Neutrophils Relative %: 58.3 % (ref 43.0–77.0)
Platelets: 254 10*3/uL (ref 150.0–400.0)
RBC: 4.73 Mil/uL (ref 3.87–5.11)
RDW: 12.8 % (ref 11.5–15.5)
WBC: 4.7 10*3/uL (ref 4.0–10.5)

## 2021-10-19 LAB — HEPATIC FUNCTION PANEL
ALT: 25 U/L (ref 0–35)
AST: 25 U/L (ref 0–37)
Albumin: 4.2 g/dL (ref 3.5–5.2)
Alkaline Phosphatase: 55 U/L (ref 39–117)
Bilirubin, Direct: 0.1 mg/dL (ref 0.0–0.3)
Total Bilirubin: 0.6 mg/dL (ref 0.2–1.2)
Total Protein: 6.4 g/dL (ref 6.0–8.3)

## 2021-10-19 LAB — TSH: TSH: 1.81 u[IU]/mL (ref 0.35–5.50)

## 2021-10-19 NOTE — Progress Notes (Signed)
Patient ID: Vickie Crawford, female   DOB: 06-08-1946, 76 y.o.   MRN: [id number]   Subjective:    Patient ID: Vickie Crawford, female    DOB: 07-02-1946, 76 y.o.   MRN: [id number]  This visit occurred during the SARS-CoV-2 public health emergency.  Safety protocols were in place, including screening questions prior to the visit, additional usage of staff PPE, and extensive cleaning of exam room while observing appropriate contact time as indicated for disinfecting solutions.   Patient here for a scheduled follow up.   Chief Complaint  Patient presents with   Hypertension   .   HPI Reports she is doing relatively well.  Staying active.  No chest pain or sob reported.  No abdominal pain or bowel change reported.  Brought in blood pressure readings.  Outside blood pressures averaging:  117-120s/60-70s.  Taking mobic.  Needs to help her function.  Stable.     Past Medical History:  Diagnosis Date   Allergy    Arthritis    fingers, toes   Glaucoma    Hypercholesterolemia    Motion sickness    Rough ocean waters   Osteopenia    Ovarian cyst    Hospitalized 1969 and 1974 for removal   Thyroid nodule    Vitamin D deficiency    Past Surgical History:  Procedure Laterality Date   CATARACT EXTRACTION W/PHACO Left 06/28/2020   Procedure: CATARACT EXTRACTION PHACO AND INTRAOCULAR LENS PLACEMENT (IOC) LEFT ISTENT INJ;  Surgeon: Nevada Crane, MD;  Location: [facility];  Service: Ophthalmology;  Laterality: Left;  7.96 0:52.3   CATARACT EXTRACTION W/PHACO Right 08/02/2020   Procedure: CATARACT EXTRACTION PHACO AND INTRAOCULAR LENS PLACEMENT (IOC) RIGHT ISTENT INJ;  Surgeon: Nevada Crane, MD;  Location: [facility];  Service: Ophthalmology;  Laterality: Right;  1.84 0:20.0   COLONOSCOPY W/ POLYPECTOMY     COLONOSCOPY WITH PROPOFOL N/A 03/07/2017   Procedure: COLONOSCOPY WITH PROPOFOL;  Surgeon: Scot Jun, MD;  Location: [facility];  Service: Endoscopy;   Laterality: N/A;   OVARIAN CYST REMOVAL     1969 and 1974   Family History  Problem Relation Age of Onset   Hypertension Mother    Heart disease Mother    Stroke Father    Brain cancer Sister    Breast cancer Neg Hx    Social History   Socioeconomic History   Marital status: Married    Spouse name: Not on file   Number of children: 2   Years of education: Not on file   Highest education level: Not on file  Occupational History   Not on file  Tobacco Use   Smoking status: Former    Types: Cigarettes    Quit date: 1995    Years since quitting: 28.1   Smokeless tobacco: Never  Vaping Use   Vaping Use: Never used  Substance and Sexual Activity   Alcohol use: Yes    Alcohol/week: 2.0 standard drinks    Types: 2 Glasses of wine per week   Drug use: No   Sexual activity: Not on file  Other Topics Concern   Not on file  Social History Narrative   Not on file   Social Determinants of Health   Financial Resource Strain: Not on file  Food Insecurity: Not on file  Transportation Needs: Not on file  Physical Activity: Not on file  Stress: Not on file  Social Connections: Not on file     Review  of Systems  Constitutional:  Negative for appetite change and unexpected weight change.  HENT:  Negative for congestion and sinus pressure.   Respiratory:  Negative for cough, chest tightness and shortness of breath.   Cardiovascular:  Negative for chest pain, palpitations and leg swelling.  Gastrointestinal:  Negative for abdominal pain, diarrhea, nausea and vomiting.  Genitourinary:  Negative for difficulty urinating and dysuria.  Musculoskeletal:  Negative for joint swelling and myalgias.  Skin:  Negative for color change and rash.  Neurological:  Negative for dizziness, light-headedness and headaches.  Psychiatric/Behavioral:  Negative for agitation and dysphoric mood.       Objective:     BP 128/76    Pulse 69    Temp 97.9 F (36.6 C)    Resp 16    Ht 5\' 1"  (1.549 m)     Wt 134 lb (60.8 kg)    SpO2 98%    BMI 25.32 kg/m  Wt Readings from Last 3 Encounters:  10/19/21 134 lb (60.8 kg)  05/20/21 133 lb (60.3 kg)  01/07/21 134 lb (60.8 kg)    Physical Exam Vitals reviewed.  Constitutional:      General: She is not in acute distress.    Appearance: Normal appearance.  HENT:     Head: Normocephalic and atraumatic.     Right Ear: External ear normal.     Left Ear: External ear normal.  Eyes:     General: No scleral icterus.       Right eye: No discharge.        Left eye: No discharge.     Conjunctiva/sclera: Conjunctivae normal.  Neck:     Thyroid: No thyromegaly.  Cardiovascular:     Rate and Rhythm: Normal rate and regular rhythm.  Pulmonary:     Effort: No respiratory distress.     Breath sounds: Normal breath sounds. No wheezing.  Abdominal:     General: Bowel sounds are normal.     Palpations: Abdomen is soft.     Tenderness: There is no abdominal tenderness.  Musculoskeletal:        General: No swelling or tenderness.     Cervical back: Neck supple. No tenderness.  Lymphadenopathy:     Cervical: No cervical adenopathy.  Skin:    Findings: No erythema or rash.  Neurological:     Mental Status: She is alert.  Psychiatric:        Mood and Affect: Mood normal.        Behavior: Behavior normal.     Outpatient Encounter Medications as of 10/19/2021  Medication Sig   Calcium Carbonate-Vitamin D (CALTRATE 600+D PO) Take 1 tablet by mouth 2 (two) times daily.   cholecalciferol (VITAMIN D) 1000 UNITS tablet Take 1,000 Units by mouth daily.   desonide (DESOWEN) 0.05 % ointment at bedtime.   dorzolamide-timolol (COSOPT) 22.3-6.8 MG/ML ophthalmic solution    fexofenadine (ALLEGRA) 180 MG tablet Take 180 mg by mouth daily.   fluticasone (FLONASE) 50 MCG/ACT nasal spray PLACE 2 SPRAYS DAILY INTO BOTH NOSTRILS.   latanoprost (XALATAN) 0.005 % ophthalmic solution INSTILL ONE DROP IN BOTH EYES AT BEDTIME.   meloxicam (MOBIC) 7.5 MG tablet TAKE  1 TABLET BY MOUTH EVERY DAY AS NEEDED   pyridOXINE (VITAMIN B-6) 100 MG tablet Take 100 mg by mouth daily.   simvastatin (ZOCOR) 20 MG tablet TAKE 1 TABLET (20 MG TOTAL) AT BEDTIME BY MOUTH.   [DISCONTINUED] benzonatate (TESSALON PERLES) 100 MG capsule Take 1 capsule (100 mg  total) by mouth every 6 (six) hours as needed for cough.   No facility-administered encounter medications on file as of 10/19/2021.     Lab Results  Component Value Date   WBC 4.7 10/19/2021   HGB 14.3 10/19/2021   HCT 43.3 10/19/2021   PLT 254.0 10/19/2021   GLUCOSE 94 10/19/2021   CHOL 203 (H) 10/19/2021   TRIG 56.0 10/19/2021   HDL 89.90 10/19/2021   LDLCALC 102 (H) 10/19/2021   ALT 25 10/19/2021   AST 25 10/19/2021   NA 141 10/19/2021   K 4.6 10/19/2021   CL 104 10/19/2021   CREATININE 0.75 10/19/2021   BUN 21 10/19/2021   CO2 33 (H) 10/19/2021   TSH 1.81 10/19/2021       Assessment & Plan:   Problem List Items Addressed This Visit     Colon cancer screening    Colonoscopy 02/2017.  Recommended f/u colonoscopy in 10 years.        Elevated blood pressure reading    Blood pressure on outside checks - 110-120s/70s.  She brought her blood pressure cuff.  Correlates.  Follow.  On no medication.        Osteopenia    Continue calcium, vitamin D and weight bearing exercise.        Pure hypercholesterolemia - Primary    On simvastatin.  Low cholesterol diet and exercise.  Follow lipid panel and liver function tests.        Relevant Orders   CBC with Differential/Platelet (Completed)   TSH (Completed)   Lipid panel (Completed)   Hepatic function panel (Completed)   Basic metabolic panel (Completed)   Lipid panel   Hepatic function panel   Basic metabolic panel     Dale [place], MD

## 2021-10-19 NOTE — Assessment & Plan Note (Signed)
On simvastatin.  Low cholesterol diet and exercise.  Follow lipid panel and liver function tests.   

## 2021-10-19 NOTE — Assessment & Plan Note (Signed)
Continue calcium, vitamin D and weight bearing exercise.  

## 2021-10-19 NOTE — Assessment & Plan Note (Signed)
Colonoscopy 02/2017.  Recommended f/u colonoscopy in 10 years.   

## 2021-10-19 NOTE — Assessment & Plan Note (Signed)
Blood pressure on outside checks - 110-120s/70s.  She brought her blood pressure cuff.  Correlates.  Follow.  On no medication.   

## 2021-10-20 ENCOUNTER — Telehealth: Payer: Self-pay

## 2021-10-20 NOTE — Telephone Encounter (Signed)
LMTCB

## 2021-10-20 NOTE — Telephone Encounter (Signed)
-----   Message from Dale Allegan, MD sent at 10/19/2021 11:50 PM EST ----- Notify Ms Dillavou that her cholesterol has increased when compared to the previous check.  Confirm she is on simvastatin 20mg  q day.  If so, then I would like to change her to crestor 20mg  q day.  Will need liver panel checked 6 weeks after starting crestor.  Hgb, thyroid test, kidney function tests and liver function tests are wnl.

## 2021-10-21 ENCOUNTER — Other Ambulatory Visit: Payer: Self-pay

## 2021-10-21 MED ORDER — ROSUVASTATIN CALCIUM 20 MG PO TABS: 20.0000 mg | ORAL_TABLET | Freq: Every day | ORAL | 3 refills | Status: DC

## 2021-10-21 NOTE — Telephone Encounter (Signed)
Patient returned office phone from Trisha. °

## 2021-10-21 NOTE — Telephone Encounter (Signed)
See result note.  

## 2021-11-07 ENCOUNTER — Ambulatory Visit: Payer: Federal, State, Local not specified - PPO | Admitting: Internal Medicine

## 2021-11-13 ENCOUNTER — Other Ambulatory Visit: Payer: Self-pay | Admitting: Internal Medicine

## 2021-12-07 ENCOUNTER — Other Ambulatory Visit (INDEPENDENT_AMBULATORY_CARE_PROVIDER_SITE_OTHER): Payer: Federal, State, Local not specified - PPO

## 2021-12-07 DIAGNOSIS — E7800 Pure hypercholesterolemia, unspecified: Secondary | ICD-10-CM

## 2021-12-07 LAB — HEPATIC FUNCTION PANEL
ALT: 21 U/L (ref 0–35)
AST: 24 U/L (ref 0–37)
Albumin: 4.1 g/dL (ref 3.5–5.2)
Alkaline Phosphatase: 57 U/L (ref 39–117)
Bilirubin, Direct: 0.1 mg/dL (ref 0.0–0.3)
Total Bilirubin: 0.6 mg/dL (ref 0.2–1.2)
Total Protein: 6.1 g/dL (ref 6.0–8.3)

## 2021-12-07 NOTE — Addendum Note (Signed)
Addended by: Warden Fillers on: 12/07/2021 02:11 PM ? ? Modules accepted: Orders ? ?

## 2021-12-09 ENCOUNTER — Telehealth: Payer: Self-pay

## 2021-12-09 NOTE — Telephone Encounter (Signed)
-----   Message from Dale [place], MD sent at 12/08/2021  3:12 AM EDT ----- ?Notify - liver function tests are within normal limits.   ?

## 2021-12-30 ENCOUNTER — Ambulatory Visit
Admission: RE | Admit: 2021-12-30 | Discharge: 2021-12-30 | Disposition: A | Discharge status: Home / Self Care | Payer: Federal, State, Local not specified - PPO | Source: Ambulatory Visit | Attending: Emergency Medicine | Admitting: Emergency Medicine

## 2021-12-30 VITALS — BP 130/69 | HR 75 | Temp 98.2°F | Resp 18

## 2021-12-30 DIAGNOSIS — J0100 Acute maxillary sinusitis, unspecified: Secondary | ICD-10-CM | POA: Diagnosis not present

## 2021-12-30 MED ORDER — AMOXICILLIN 875 MG PO TABS: 875.0000 mg | ORAL_TABLET | Freq: Two times a day (BID) | ORAL | 0 refills | Status: AC

## 2021-12-30 NOTE — ED Provider Notes (Signed)
?[facility] ? ? ? ?CSN: [id number] ?Arrival date & time: 12/30/21  8832 ? ? ?  ? ?History   ?Chief Complaint ?Chief Complaint  ?Patient presents with  ? Cough  ?  Entered by patient  ? Nasal Congestion  ? ? ?HPI ?Vickie Crawford is a 76 y.o. female.  Patient presents with 10-day history of sinus congestion, postnasal drip, runny nose, cough.  She recently returned from a 10-day cruise to [place].  Negative at-home COVID test.  She denies fever, ear pain, sore throat, shortness of breath, chest pain, vomiting, diarrhea, or other symptoms.  Treatment at home with NyQuil.  Patient was seen at this urgent care on 10/06/2021; diagnosed with sinusitis; treated with Augmentin and benzonatate. ? ?The history is provided by the patient and medical records.  ? ?Past Medical History:  ?Diagnosis Date  ? Allergy   ? Arthritis   ? fingers, toes  ? Glaucoma   ? Hypercholesterolemia   ? Motion sickness   ? Rough ocean waters  ? Osteopenia   ? Ovarian cyst   ? Hospitalized 1969 and 1974 for removal  ? Thyroid nodule   ? Vitamin D deficiency   ? ? ?Patient Active Problem List  ? Diagnosis Date Noted  ? COVID-19 virus infection 12/30/2020  ? Nasal congestion 12/30/2020  ? Glaucoma 10/29/2018  ? Elevated blood pressure reading 12/30/2017  ? Acute frontal sinusitis 08/16/2015  ? Colon cancer screening 07/04/2015  ? Health care maintenance 12/29/2014  ? Skin lesion 12/12/2013  ? Vitamin D deficiency 12/22/2012  ? Osteopenia 12/22/2012  ? URI (upper respiratory infection) 12/06/2012  ? 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 Pure hypercholesterolemia 10/08/2012  ? ? ?Past Surgical History:  ?Procedure Laterality Date  ? CATARACT EXTRACTION W/PHACO Left 06/28/2020  ? Procedure: CATARACT EXTRACTION PHACO AND INTRAOCULAR LENS PLACEMENT (IOC) LEFT ISTENT INJ;  Surgeon: Nevada Crane, MD;  Location: [facility];  Service: Ophthalmology;  Laterality: Left;  7.96 ?0:52.3  ? CATARACT EXTRACTION W/PHACO Right 08/02/2020  ?  Procedure: CATARACT EXTRACTION PHACO AND INTRAOCULAR LENS PLACEMENT (IOC) RIGHT ISTENT INJ;  Surgeon: Nevada Crane, MD;  Location: [facility];  Service: Ophthalmology;  Laterality: Right;  1.84 ?0:20.0  ? COLONOSCOPY W/ POLYPECTOMY    ? COLONOSCOPY WITH PROPOFOL N/A 03/07/2017  ? Procedure: COLONOSCOPY WITH PROPOFOL;  Surgeon: Scot Jun, MD;  Location: [facility];  Service: Endoscopy;  Laterality: N/A;  ? OVARIAN CYST REMOVAL    ? 1969 and 1974  ? ? ?OB History   ?No obstetric history on file. ?  ? ? ? ?Home Medications   ? ?Prior to Admission medications   ?Medication Sig Start Date End Date Taking? Authorizing Provider  ?amoxicillin (AMOXIL) 875 MG tablet Take 1 tablet (875 mg total) by mouth 2 (two) times daily for 10 days. 12/30/21 01/09/22 Yes Mickie Bail, NP  ?Calcium Carbonate-Vitamin D (CALTRATE 600+D PO) Take 1 tablet by mouth 2 (two) times daily.    [provider]  ?cholecalciferol (VITAMIN D) 1000 UNITS tablet Take 1,000 Units by mouth daily.    [provider]  ?desonide (DESOWEN) 0.05 % ointment at bedtime. 10/29/14   [provider]  ?dorzolamide-timolol (COSOPT) 22.3-6.8 MG/ML ophthalmic solution  09/03/18   [provider]  ?fexofenadine (ALLEGRA) 180 MG tablet Take 180 mg by mouth daily.    [provider]  ?fluticasone (FLONASE) 50 MCG/ACT nasal spray PLACE 2 SPRAYS DAILY INTO BOTH NOSTRILS. 11/14/21  Dale DurhamScott, Charlene, MD  ?latanoprost (XALATAN) 0.005 % ophthalmic solution INSTILL ONE DROP IN BOTH EYES AT BEDTIME. 06/18/15   [provider]  ?meloxicam (MOBIC) 7.5 MG tablet TAKE 1 TABLET BY MOUTH EVERY DAY AS NEEDED 07/15/21   Dale DurhamScott, Charlene, MD  ?pyridOXINE (VITAMIN B-6) 100 MG tablet Take 100 mg by mouth daily.    [provider]  ?rosuvastatin (CRESTOR) 20 MG tablet Take 1 tablet (20 mg total) by mouth daily. 10/21/21   Dale DurhamScott, Charlene, MD  ? ? ?Family History ?Family History  ?Problem Relation Age of Onset  ?  Hypertension Mother   ? Heart disease Mother   ? Stroke Father   ? Brain cancer Sister   ? Breast cancer Neg Hx   ? ? ?Social History ?Social History  ? ?Tobacco Use  ? Smoking status: Former  ?  Types: Cigarettes  ?  Quit date: 1995  ?  Years since quitting: 28.3  ? Smokeless tobacco: Never  ?Vaping Use  ? Vaping Use: Never used  ?Substance Use Topics  ? Alcohol use: Yes  ?  Alcohol/week: 2.0 standard drinks  ?  Types: 2 Glasses of wine per week  ? Drug use: No  ? ? ? ?Allergies   ?Patient has no known allergies. ? ? ?Review of Systems ?Review of Systems  ?Constitutional:  Negative for chills and fever.  ?HENT:  Positive for congestion, postnasal drip, rhinorrhea and sinus pressure. Negative for ear pain and sore throat.   ?Respiratory:  Positive for cough. Negative for shortness of breath.   ?Cardiovascular:  Negative for chest pain and palpitations.  ?Gastrointestinal:  Negative for diarrhea and vomiting.  ?Skin:  Negative for color change and rash.  ?All other systems reviewed and are negative. ? ? ?Physical Exam ?Triage Vital Signs ?ED Triage Vitals  ?Enc Vitals Group  ?   BP   ?   Pulse   ?   Resp   ?   Temp   ?   Temp src   ?   SpO2   ?   Weight   ?   Height   ?   Head Circumference   ?   Peak Flow   ?   Pain Score   ?   Pain Loc   ?   Pain Edu?   ?   Excl. in GC?   ? ?No data found. ? ?Updated Vital Signs ?BP 130/69   Pulse 75   Temp 98.2 ?F (36.8 ?C)   Resp 18   SpO2 95%  ? ?Visual Acuity ?Right Eye Distance:   ?Left Eye Distance:   ?Bilateral Distance:   ? ?Right Eye Near:   ?Left Eye Near:    ?Bilateral Near:    ? ?Physical Exam ?Vitals and nursing note reviewed.  ?Constitutional:   ?   General: She is not in acute distress. ?   Appearance: Normal appearance. She is well-developed. She is not ill-appearing.  ?HENT:  ?   Right Ear: Tympanic membrane normal.  ?   Left Ear: Tympanic membrane normal.  ?   Nose: Congestion and rhinorrhea present.  ?   Mouth/Throat:  ?   Mouth: Mucous membranes are moist.   ?   Pharynx: Oropharynx is clear.  ?Cardiovascular:  ?   Rate and Rhythm: Normal rate and regular rhythm.  ?   Heart sounds: Normal heart sounds.  ?Pulmonary:  ?   Effort: Pulmonary effort is normal. No respiratory distress.  ?  Breath sounds: Normal breath sounds.  ?Musculoskeletal:  ?   Cervical back: Neck supple.  ?Skin: ?   General: Skin is warm and dry.  ?Neurological:  ?   Mental Status: She is alert.  ?Psychiatric:     ?   Mood and Affect: Mood normal.     ?   Behavior: Behavior normal.  ? ? ? ?UC Treatments / Results  ?Labs ?(all labs ordered are listed, but only abnormal results are displayed) ?Labs Reviewed - No data to display ? ?EKG ? ? ?Radiology ?No results found. ? ?Procedures ?Procedures (including critical care time) ? ?Medications Ordered in UC ?Medications - No data to display ? ?Initial Impression / Assessment and Plan / UC Course  ?I have reviewed the triage vital signs and the nursing notes. ? ?Pertinent labs & imaging results that were available during my care of the patient were reviewed by me and considered in my medical decision making (see chart for details). ? ?Acute sinusitis.  Patient has been symptomatic for 10 days and is not improving with OTC medications.  Treating with amoxicillin.  Discussed symptomatic care, including Tylenol or ibuprofen as needed.  Instructed patient to follow-up with her PCP if her symptoms are not improving.  She agrees to plan of care. ? ? ?Final Clinical Impressions(s) / UC Diagnoses  ? ?Final diagnoses:  ?Acute non-recurrent maxillary sinusitis  ? ? ? ?Discharge Instructions   ? ?  ?Take amoxicillin as directed.  Follow up with your primary care provider if your symptoms are not improving.   ? ? ? ? ? ?ED Prescriptions   ? ? Medication Sig Dispense Auth. Provider  ? amoxicillin (AMOXIL) 875 MG tablet Take 1 tablet (875 mg total) by mouth 2 (two) times daily for 10 days. 20 tablet Mickie Bail, NP  ? ?  ? ?PDMP not reviewed this encounter. ?  ?Mickie Bail, NP ?12/30/21 1004 ? ?

## 2021-12-30 NOTE — ED Triage Notes (Signed)
Pt c/o cough, congestion, runny nose, and drainage x 10 days.Pt was in the [place] when symptoms started. At home covid test last week was negative.  ?

## 2021-12-30 NOTE — Discharge Instructions (Addendum)
Take amoxicillin as directed.    Follow up with your primary care provider if your symptoms are not improving.     

## 2022-01-10 ENCOUNTER — Other Ambulatory Visit: Payer: Self-pay | Admitting: Internal Medicine

## 2022-01-26 ENCOUNTER — Other Ambulatory Visit: Payer: Self-pay | Admitting: Internal Medicine

## 2022-01-26 DIAGNOSIS — Z1231 Encounter for screening mammogram for malignant neoplasm of breast: Secondary | ICD-10-CM

## 2022-02-24 ENCOUNTER — Other Ambulatory Visit: Payer: Self-pay | Admitting: Internal Medicine

## 2022-02-28 IMAGING — MG MM DIGITAL SCREENING BILAT W/ TOMO AND CAD
8 series · 9 of 24 positions shown · non-contrast
Comparison: Previous exam(s).

ACR Breast Density Category a: The breast tissue is almost entirely
fatty.

CLINICAL DATA: Screening.

EXAM:
DIGITAL SCREENING BILATERAL MAMMOGRAM WITH TOMOSYNTHESIS AND CAD
TECHNIQUE: Bilateral screening digital craniocaudal and mediolateral oblique
mammograms were obtained. Bilateral screening digital breast
tomosynthesis was performed. The images were evaluated with
computer-aided detection.

[R CC synth-2D]
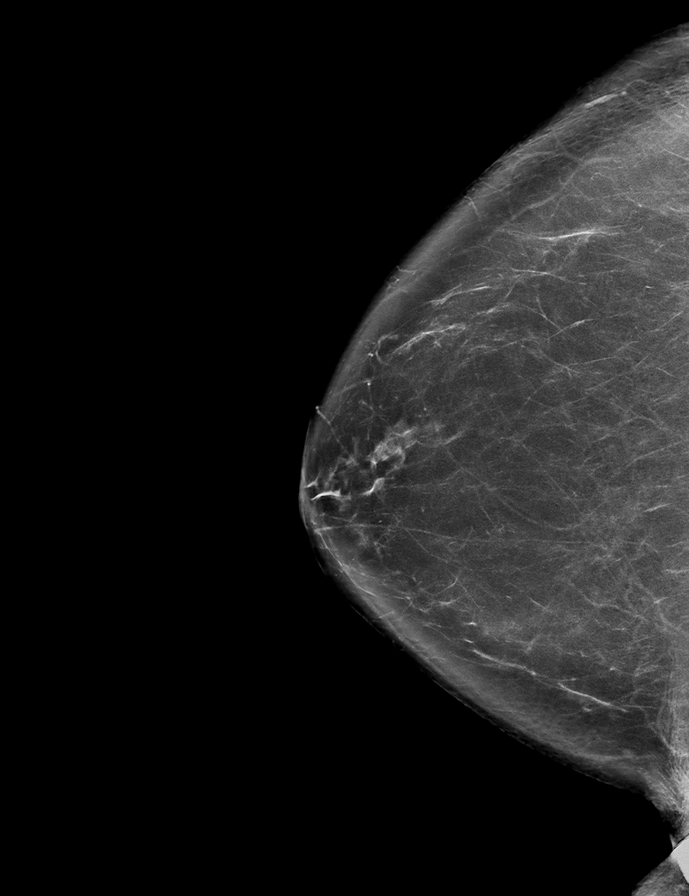

[L MLO synth-2D]
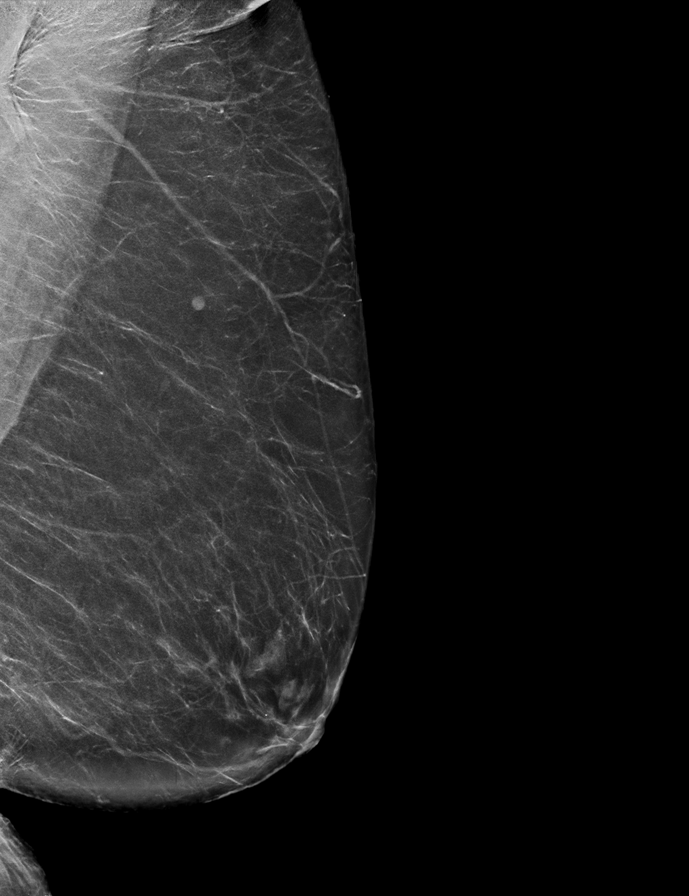

[R MLO synth-2D]
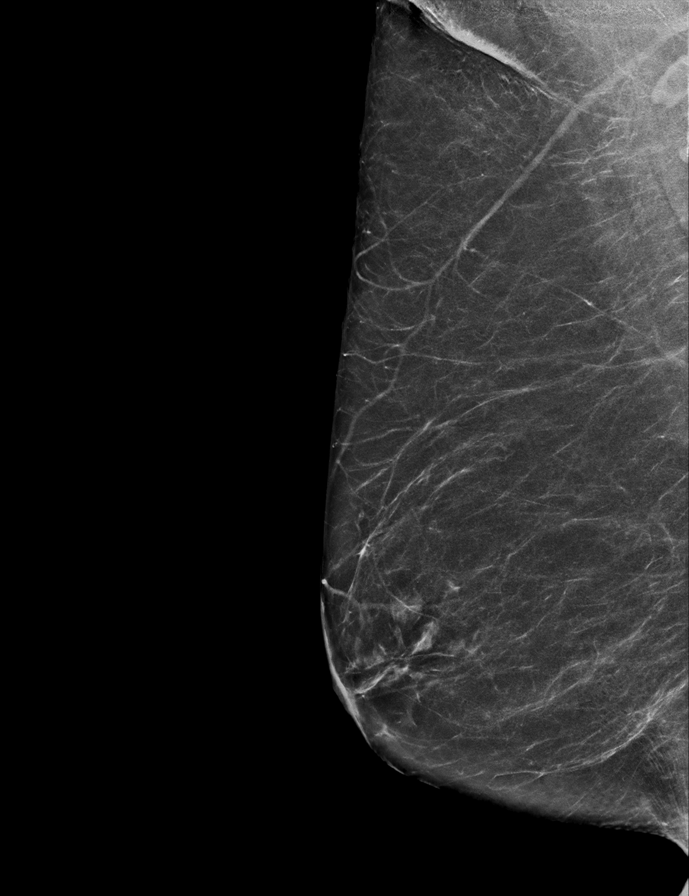

[L CC synth-2D]
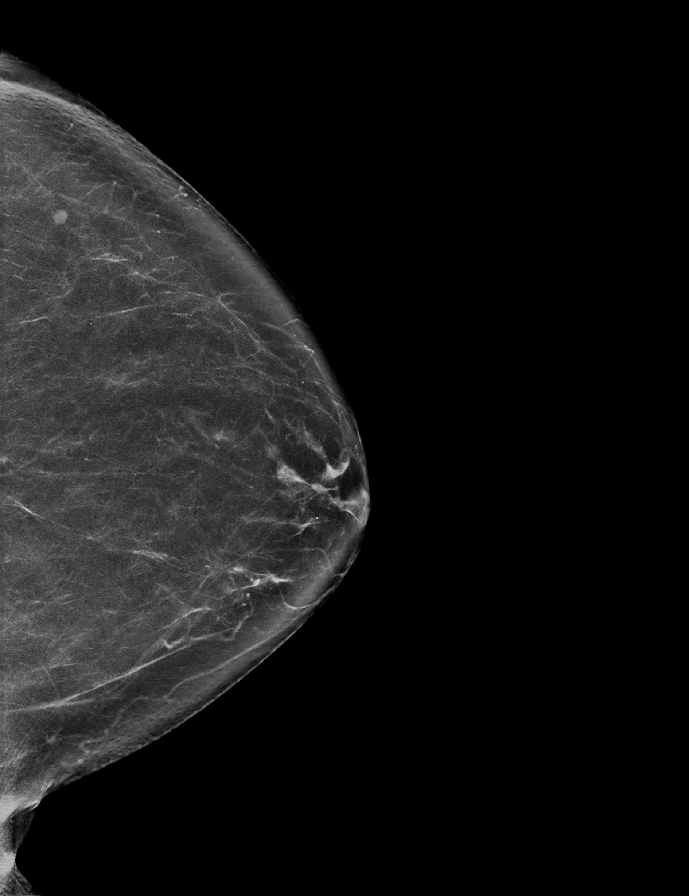

[L MLO tomo · 2 of 69 frames shown]
[frame 23/69]
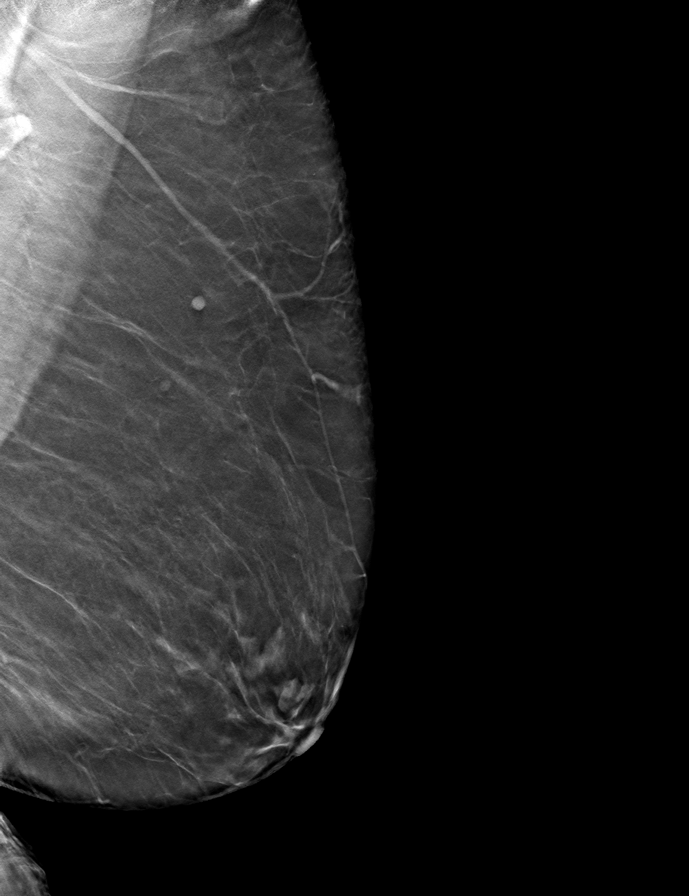
[frame 35/69]
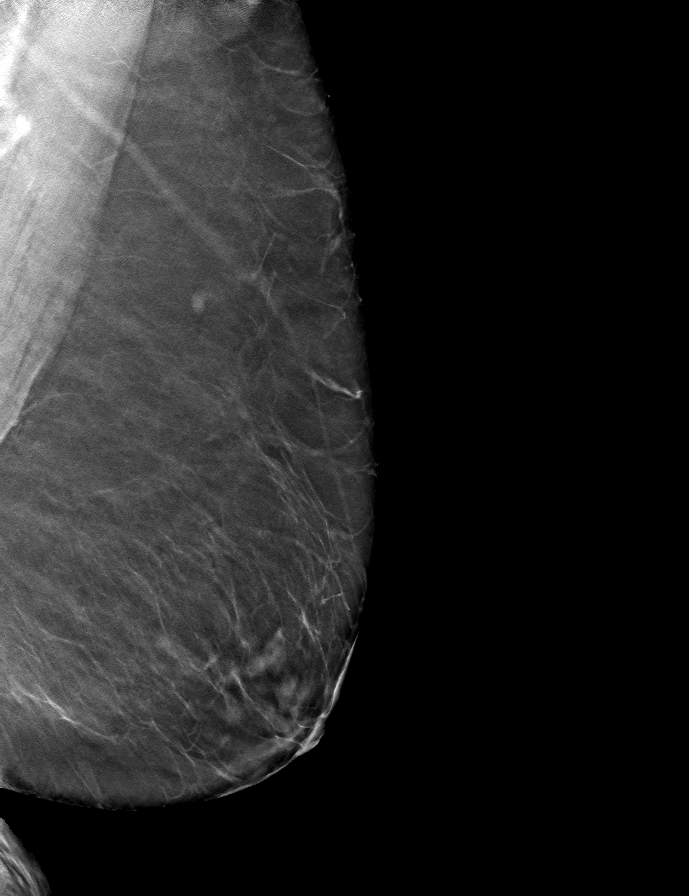

[R MLO tomo · tomo slice 31/62.0]
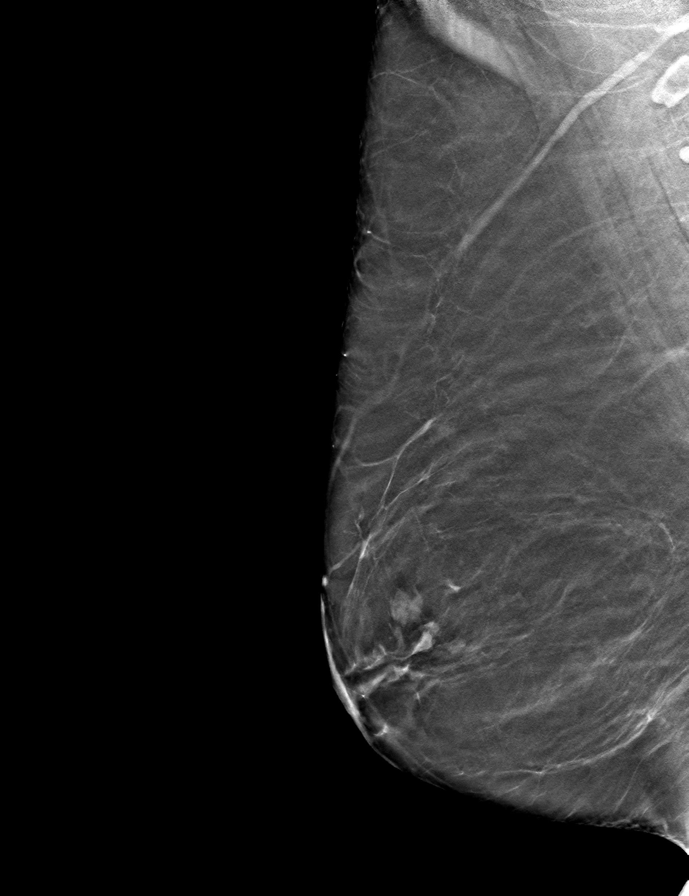

[R CC tomo · tomo slice 39/77.0]
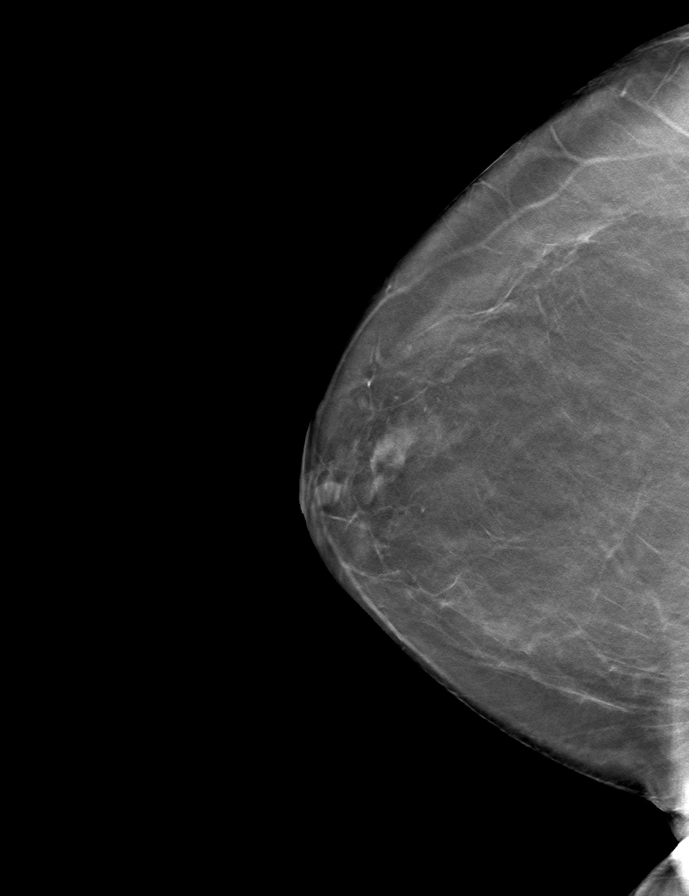

[L CC tomo · tomo slice 35/70.0]
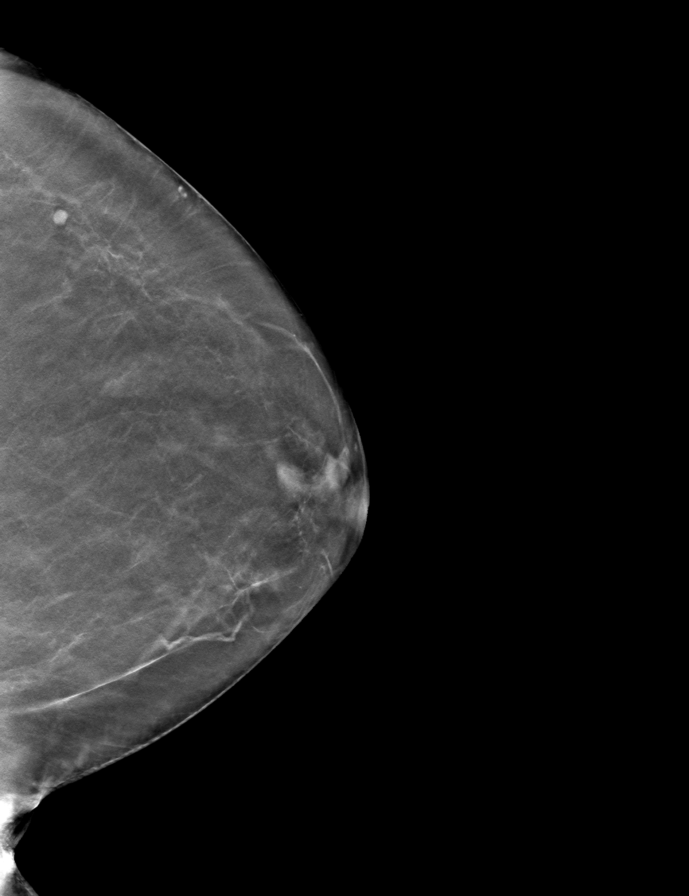

[9 of 24 positions shown; findings below may reference images not displayed]

FINDINGS: There are no findings suspicious for malignancy.
IMPRESSION: No mammographic evidence of malignancy. A result letter of this
screening mammogram will be mailed directly to the patient.

RECOMMENDATION:
Screening mammogram in one year. (Code:0E-3-N98)

BI-RADS CATEGORY  1: Negative.

## 2022-03-06 ENCOUNTER — Ambulatory Visit
Admission: RE | Admit: 2022-03-06 | Discharge: 2022-03-06 | Disposition: A | Discharge status: Home / Self Care | Payer: Federal, State, Local not specified - PPO | Source: Ambulatory Visit | Attending: Internal Medicine | Admitting: Internal Medicine

## 2022-03-06 DIAGNOSIS — Z1231 Encounter for screening mammogram for malignant neoplasm of breast: Secondary | ICD-10-CM | POA: Diagnosis present

## 2022-04-14 ENCOUNTER — Telehealth: Payer: Self-pay | Admitting: Internal Medicine

## 2022-04-14 ENCOUNTER — Other Ambulatory Visit: Payer: Self-pay

## 2022-04-14 DIAGNOSIS — E7800 Pure hypercholesterolemia, unspecified: Secondary | ICD-10-CM

## 2022-04-14 NOTE — Telephone Encounter (Signed)
Labs ordered.

## 2022-04-14 NOTE — Telephone Encounter (Signed)
Patient has a lab appt 04/17/2022, there are no orders in. 

## 2022-04-15 NOTE — Telephone Encounter (Signed)
Reviewed lab orders.  CBC and TSH deleted due to just being drawn in 09/2021 and wnl.  Remainder of labs - met b, liver panel and lipid panel - orders signed.

## 2022-04-17 ENCOUNTER — Other Ambulatory Visit (INDEPENDENT_AMBULATORY_CARE_PROVIDER_SITE_OTHER): Payer: Federal, State, Local not specified - PPO

## 2022-04-17 DIAGNOSIS — E7800 Pure hypercholesterolemia, unspecified: Secondary | ICD-10-CM | POA: Diagnosis not present

## 2022-04-17 LAB — CBC WITH DIFFERENTIAL/PLATELET
Basophils Absolute: 0.1 10*3/uL (ref 0.0–0.1)
Basophils Relative: 2.1 % (ref 0.0–3.0)
Eosinophils Absolute: 0.2 10*3/uL (ref 0.0–0.7)
Eosinophils Relative: 5.1 % — ABNORMAL HIGH (ref 0.0–5.0)
HCT: 43.5 % (ref 36.0–46.0)
Hemoglobin: 14.5 g/dL (ref 12.0–15.0)
Lymphocytes Relative: 34.3 % (ref 12.0–46.0)
Lymphs Abs: 1.7 10*3/uL (ref 0.7–4.0)
MCHC: 33.2 g/dL (ref 30.0–36.0)
MCV: 91.9 fl (ref 78.0–100.0)
Monocytes Absolute: 0.6 10*3/uL (ref 0.1–1.0)
Monocytes Relative: 12.2 % — ABNORMAL HIGH (ref 3.0–12.0)
Neutro Abs: 2.3 10*3/uL (ref 1.4–7.7)
Neutrophils Relative %: 46.3 % (ref 43.0–77.0)
Platelets: 214 10*3/uL (ref 150.0–400.0)
RBC: 4.73 Mil/uL (ref 3.87–5.11)
RDW: 13.2 % (ref 11.5–15.5)
WBC: 4.9 10*3/uL (ref 4.0–10.5)

## 2022-04-17 LAB — BASIC METABOLIC PANEL
BUN: 29 mg/dL — ABNORMAL HIGH (ref 6–23)
CO2: 26 mEq/L (ref 19–32)
Calcium: 8.8 mg/dL (ref 8.4–10.5)
Chloride: 107 mEq/L (ref 96–112)
Creatinine, Ser: 0.71 mg/dL (ref 0.40–1.20)
GFR: 81.72 mL/min (ref >60.00)
Glucose, Bld: 93 mg/dL (ref 70–99)
Potassium: 4.5 mEq/L (ref 3.5–5.1)
Sodium: 139 mEq/L (ref 135–145)

## 2022-04-17 LAB — HEPATIC FUNCTION PANEL
ALT: 25 U/L (ref 0–35)
AST: 25 U/L (ref 0–37)
Albumin: 4 g/dL (ref 3.5–5.2)
Alkaline Phosphatase: 51 U/L (ref 39–117)
Bilirubin, Direct: 0.1 mg/dL (ref 0.0–0.3)
Total Bilirubin: 0.5 mg/dL (ref 0.2–1.2)
Total Protein: 6.2 g/dL (ref 6.0–8.3)

## 2022-04-17 LAB — LIPID PANEL
Cholesterol: 177 mg/dL (ref 0–200)
HDL: 94.2 mg/dL (ref >39.00)
LDL Cholesterol: 72 mg/dL (ref 0–99)
NonHDL: 83.16
Total CHOL/HDL Ratio: 2
Triglycerides: 57 mg/dL (ref 0.0–149.0)
VLDL: 11.4 mg/dL (ref 0.0–40.0)

## 2022-04-17 LAB — TSH: TSH: 1.85 u[IU]/mL (ref 0.35–5.50)

## 2022-04-17 NOTE — Telephone Encounter (Signed)
Vickie Crawford is off today. CBC & TSH were NOT deleted. All labs were drawn this morning & have resulted.

## 2022-04-20 ENCOUNTER — Encounter: Payer: Self-pay | Admitting: Internal Medicine

## 2022-04-20 ENCOUNTER — Ambulatory Visit: Payer: Federal, State, Local not specified - PPO | Admitting: Internal Medicine

## 2022-04-20 DIAGNOSIS — Z1211 Encounter for screening for malignant neoplasm of colon: Secondary | ICD-10-CM | POA: Diagnosis not present

## 2022-04-20 DIAGNOSIS — E7800 Pure hypercholesterolemia, unspecified: Secondary | ICD-10-CM

## 2022-04-20 DIAGNOSIS — R059 Cough, unspecified: Secondary | ICD-10-CM | POA: Diagnosis not present

## 2022-04-20 DIAGNOSIS — R030 Elevated blood-pressure reading, without diagnosis of hypertension: Secondary | ICD-10-CM | POA: Diagnosis not present

## 2022-04-20 MED ORDER — PREDNISONE 10 MG PO TABS: ORAL_TABLET | ORAL | 0 refills | Status: DC

## 2022-04-20 NOTE — Progress Notes (Signed)
Patient ID: Vickie Crawford, female   DOB: 1946/02/21, 76 y.o.   MRN: [id number]   Subjective:    Patient ID: Vickie Crawford, female    DOB: [birth year], 76 y.o.   MRN: [id number]   Patient here for a scheduled follow up.   Chief Complaint  Patient presents with   Hyperlipidemia   .   HPI Reports she is doing relatively well.  Does report - throat dry/cough - two weeks.  No chest tightness.  No chest pain or sob reported.  No abdominal pain or bowel change reported.     Past Medical History:  Diagnosis Date   Allergy    Arthritis    fingers, toes   Glaucoma    Hypercholesterolemia    Motion sickness    Rough ocean waters   Osteopenia    Ovarian cyst    Hospitalized 1969 and 1974 for removal   Thyroid nodule    Vitamin D deficiency    Past Surgical History:  Procedure Laterality Date   CATARACT EXTRACTION W/PHACO Left 06/28/2020   Procedure: CATARACT EXTRACTION PHACO AND INTRAOCULAR LENS PLACEMENT (IOC) LEFT ISTENT INJ;  Surgeon: Nevada Crane, MD;  Location: [facility];  Service: Ophthalmology;  Laterality: Left;  7.96 0:52.3   CATARACT EXTRACTION W/PHACO Right 08/02/2020   Procedure: CATARACT EXTRACTION PHACO AND INTRAOCULAR LENS PLACEMENT (IOC) RIGHT ISTENT INJ;  Surgeon: Nevada Crane, MD;  Location: [facility];  Service: Ophthalmology;  Laterality: Right;  1.84 0:20.0   COLONOSCOPY W/ POLYPECTOMY     COLONOSCOPY WITH PROPOFOL N/A 03/07/2017   Procedure: COLONOSCOPY WITH PROPOFOL;  Surgeon: Scot Jun, MD;  Location: [facility];  Service: Endoscopy;  Laterality: N/A;   OVARIAN CYST REMOVAL     1969 and 1974   Family History  Problem Relation Age of Onset   Hypertension Mother    Heart disease Mother    Stroke Father    Brain cancer Sister    Breast cancer Neg Hx    Social History   Socioeconomic History   Marital status: Married    Spouse name: Not on file   Number of children: 2   Years of education: Not on file   Highest  education level: Not on file  Occupational History   Not on file  Tobacco Use   Smoking status: Former    Types: Cigarettes    Quit date: 1995    Years since quitting: 28.6   Smokeless tobacco: Never  Vaping Use   Vaping Use: Never used  Substance and Sexual Activity   Alcohol use: Yes    Alcohol/week: 2.0 standard drinks of alcohol    Types: 2 Glasses of wine per week   Drug use: No   Sexual activity: Not on file  Other Topics Concern   Not on file  Social History Narrative   Not on file   Social Determinants of Health   Financial Resource Strain: Not on file  Food Insecurity: Not on file  Transportation Needs: Not on file  Physical Activity: Not on file  Stress: Not on file  Social Connections: Not on file     Review of Systems  Constitutional:  Negative for appetite change and unexpected weight change.  HENT:  Negative for congestion and sinus pressure.   Respiratory:  Negative for chest tightness and shortness of breath.        Dry cough.   Cardiovascular:  Negative for chest pain, palpitations and leg swelling.  Gastrointestinal:  Negative for abdominal pain, diarrhea, nausea and vomiting.  Genitourinary:  Negative for difficulty urinating and dysuria.  Musculoskeletal:  Negative for joint swelling and myalgias.  Skin:  Negative for color change and rash.  Neurological:  Negative for dizziness, light-headedness and headaches.  Psychiatric/Behavioral:  Negative for agitation and dysphoric mood.        Objective:     BP 130/76 (BP Location: Left Arm, Patient Position: Sitting, Cuff Size: Small)   Pulse 90   Temp 98.4 F (36.9 C) (Temporal)   Resp 17   Ht 5\' 1"  (1.549 m)   Wt 130 lb 6.4 oz (59.1 kg)   SpO2 96%   BMI 24.64 kg/m  Wt Readings from Last 3 Encounters:  04/20/22 130 lb 6.4 oz (59.1 kg)  10/19/21 134 lb (60.8 kg)  05/20/21 133 lb (60.3 kg)    Physical Exam Vitals reviewed.  Constitutional:      General: She is not in acute distress.     Appearance: Normal appearance.  HENT:     Head: Normocephalic and atraumatic.     Right Ear: External ear normal.     Left Ear: External ear normal.  Eyes:     General: No scleral icterus.       Right eye: No discharge.        Left eye: No discharge.     Conjunctiva/sclera: Conjunctivae normal.  Neck:     Thyroid: No thyromegaly.  Cardiovascular:     Rate and Rhythm: Normal rate and regular rhythm.  Pulmonary:     Effort: No respiratory distress.     Breath sounds: Normal breath sounds. No wheezing.  Abdominal:     General: Bowel sounds are normal.     Palpations: Abdomen is soft.     Tenderness: There is no abdominal tenderness.  Musculoskeletal:        General: No swelling or tenderness.     Cervical back: Neck supple. No tenderness.  Lymphadenopathy:     Cervical: No cervical adenopathy.  Skin:    Findings: No erythema or rash.  Neurological:     Mental Status: She is alert.  Psychiatric:        Mood and Affect: Mood normal.        Behavior: Behavior normal.      Outpatient Encounter Medications as of 04/20/2022  Medication Sig   Calcium Carbonate-Vitamin D (CALTRATE 600+D PO) Take 1 tablet by mouth 2 (two) times daily.   cholecalciferol (VITAMIN D) 1000 UNITS tablet Take 1,000 Units by mouth daily.   desonide (DESOWEN) 0.05 % ointment at bedtime.   dorzolamide-timolol (COSOPT) 22.3-6.8 MG/ML ophthalmic solution PLACE 1 DROP TWICE DAILY TO LEFT EYE   fexofenadine (ALLEGRA) 180 MG tablet Take 180 mg by mouth daily.   fluticasone (FLONASE) 50 MCG/ACT nasal spray PLACE 2 SPRAYS DAILY INTO BOTH NOSTRILS.   latanoprost (XALATAN) 0.005 % ophthalmic solution INSTILL ONE DROP IN BOTH EYES AT BEDTIME.   meloxicam (MOBIC) 7.5 MG tablet TAKE 1 TABLET BY MOUTH EVERY DAY AS NEEDED   predniSONE (DELTASONE) 10 MG tablet Take 4 tablets x 1 day and then decrease by 1/2 tablet per day until down to zero mg   pyridOXINE (VITAMIN B-6) 100 MG tablet Take 100 mg by mouth daily.    rosuvastatin (CRESTOR) 20 MG tablet Take 1 tablet (20 mg total) by mouth daily.   No facility-administered encounter medications on file as of 04/20/2022.     Lab Results  Component Value  Date   WBC 4.9 04/17/2022   HGB 14.5 04/17/2022   HCT 43.5 04/17/2022   PLT 214.0 04/17/2022   GLUCOSE 93 04/17/2022   CHOL 177 04/17/2022   TRIG 57.0 04/17/2022   HDL 94.20 04/17/2022   LDLCALC 72 04/17/2022   ALT 25 04/17/2022   AST 25 04/17/2022   NA 139 04/17/2022   K 4.5 04/17/2022   CL 107 04/17/2022   CREATININE 0.71 04/17/2022   BUN 29 (H) 04/17/2022   CO2 26 04/17/2022   TSH 1.85 04/17/2022    MM 3D SCREEN BREAST BILATERAL  Result Date: 03/07/2022 CLINICAL DATA:  Screening. EXAM: DIGITAL SCREENING BILATERAL MAMMOGRAM WITH TOMOSYNTHESIS AND CAD TECHNIQUE: Bilateral screening digital craniocaudal and mediolateral oblique mammograms were obtained. Bilateral screening digital breast tomosynthesis was performed. The images were evaluated with computer-aided detection. COMPARISON:  Previous exam(s). ACR Breast Density Category a: The breast tissue is almost entirely fatty. FINDINGS: There are no findings suspicious for malignancy. IMPRESSION: No mammographic evidence of malignancy. A result letter of this screening mammogram will be mailed directly to the patient. RECOMMENDATION: Screening mammogram in one year. (Code:[id number]-B-01Y) BI-RADS CATEGORY  1: Negative. Electronically Signed   By: Amie Portland M.D.   On: 03/07/2022 10:02       Assessment & Plan:   Problem List Items Addressed This Visit     Colon cancer screening    Colonoscopy 02/2017.  Recommended f/u colonoscopy in 10 years.        Cough    Delsym cough syrup.  Discussed if persistent, will need further w/up.        Elevated blood pressure reading    Reviewed blood pressures - outside checks - averaging 110-120s/70s.  Continue to spot check pressure.  Hold on medication.        Pure hypercholesterolemia    On simvastatin.   Low cholesterol diet and exercise.  Follow lipid panel and liver function tests.        Relevant Orders   Lipid panel   Hepatic function panel   Basic metabolic panel     Dale Chewton, MD

## 2022-04-20 NOTE — Patient Instructions (Signed)
Delsym cough syrup.

## 2022-04-22 ENCOUNTER — Encounter: Payer: Self-pay | Admitting: Internal Medicine

## 2022-04-22 DIAGNOSIS — R059 Cough, unspecified: Secondary | ICD-10-CM | POA: Insufficient documentation

## 2022-04-22 NOTE — Assessment & Plan Note (Signed)
Colonoscopy 02/2017.  Recommended f/u colonoscopy in 10 years.   

## 2022-04-22 NOTE — Assessment & Plan Note (Signed)
On simvastatin.  Low cholesterol diet and exercise.  Follow lipid panel and liver function tests.   

## 2022-04-22 NOTE — Assessment & Plan Note (Signed)
Reviewed blood pressures - outside checks - averaging 110-120s/70s.  Continue to spot check pressure.  Hold on medication.

## 2022-04-22 NOTE — Assessment & Plan Note (Signed)
Delsym cough syrup.  Discussed if persistent, will need further w/up.

## 2022-05-09 ENCOUNTER — Other Ambulatory Visit: Payer: Self-pay | Admitting: Internal Medicine

## 2022-05-11 ENCOUNTER — Other Ambulatory Visit: Payer: Self-pay | Admitting: Internal Medicine

## 2022-06-14 ENCOUNTER — Ambulatory Visit (LOCAL_COMMUNITY_HEALTH_CENTER): Payer: Federal, State, Local not specified - PPO

## 2022-06-14 DIAGNOSIS — Z23 Encounter for immunization: Secondary | ICD-10-CM

## 2022-06-14 DIAGNOSIS — Z719 Counseling, unspecified: Secondary | ICD-10-CM

## 2022-06-14 NOTE — Progress Notes (Signed)
  Are you feeling sick today? No   Have you ever received a dose of COVID-19 Vaccine? [employer], [place], [place], [place], Other) Yes  If yes, which vaccine and how many doses?   PFIZER, 5   Did you bring the vaccination record card or other documentation?  Yes   Do you have a health condition or are undergoing treatment that makes you moderately or severely immunocompromised? This would include, but not be limited to: cancer, HIV, organ transplant, immunosuppressive therapy/high-dose corticosteroids, or moderate/severe primary immunodeficiency.  No  Have you received COVID-19 vaccine before or during hematopoietic cell transplant (HCT) or CAR-T-cell therapies? No  Have you ever had an allergic reaction to: (This would include a severe allergic reaction or a reaction that caused hives, swelling, or respiratory distress, including wheezing.) A component of a COVID-19 vaccine or a previous dose of COVID-19 vaccine? No   Have you ever had an allergic reaction to another vaccine (other thanCOVID-19 vaccine) or an injectable medication? (This would include a severe allergic reaction or a reaction that caused hives, swelling, or respiratory distress, including wheezing.)   No    Do you have a history of any of the following:  Myocarditis or Pericarditis No  Dermal fillers:  YES  Multisystem Inflammatory Syndrome (MIS-C or MIS-A)? No  COVID-19 disease within the past 3 months? No  Vaccinated with monkeypox vaccine in the last 4 weeks? No   Eligible and administered Comirnaty12Y+2023-24, monitored, tolerated well. Verbalized understanding of VIS and NCIR.M.Ronnald Ramp, LPN

## 2022-07-08 ENCOUNTER — Other Ambulatory Visit: Payer: Self-pay | Admitting: Internal Medicine

## 2022-07-28 ENCOUNTER — Ambulatory Visit
Admission: EM | Admit: 2022-07-28 | Discharge: 2022-07-28 | Disposition: A | Discharge status: Home / Self Care | Payer: Federal, State, Local not specified - PPO | Attending: Emergency Medicine | Admitting: Emergency Medicine

## 2022-07-28 DIAGNOSIS — R051 Acute cough: Secondary | ICD-10-CM | POA: Diagnosis not present

## 2022-07-28 DIAGNOSIS — R0982 Postnasal drip: Secondary | ICD-10-CM | POA: Diagnosis not present

## 2022-07-28 MED ORDER — PREDNISONE 10 MG (21) PO TBPK: ORAL_TABLET | Freq: Every day | ORAL | 0 refills | Status: DC

## 2022-07-28 NOTE — Discharge Instructions (Addendum)
Take the prednisone as directed.  Follow up with your primary care provider if your symptoms are not improving.    

## 2022-07-28 NOTE — ED Provider Notes (Signed)
[name]    CSN: [id number] Arrival date & time: 07/28/22  7846      History   Chief Complaint Chief Complaint  Patient presents with   Cough    Entered by patient    HPI CHRYSTEL BAREFIELD is a 76 y.o. female.  Patient presents with 7 day history of postnasal drip and nonproductive cough.  She denies fever, sore throat, shortness of breath, vomiting, diarrhea, or other symptoms.  Treatment attempted with Nyquil.  Her medical history includes allergies, arthritis, osteopenia, glaucoma.   The history is provided by the patient and medical records.    Past Medical History:  Diagnosis Date   Allergy    Arthritis    fingers, toes   Glaucoma    Hypercholesterolemia    Motion sickness    Rough ocean waters   Osteopenia    Ovarian cyst    Hospitalized 1969 and 1974 for removal   Thyroid nodule    Vitamin D deficiency     Patient Active Problem List   Diagnosis Date Noted   Cough 04/22/2022   COVID-19 virus infection 12/30/2020   Nasal congestion 12/30/2020   Glaucoma 10/29/2018   Elevated blood pressure reading 12/30/2017   Acute frontal sinusitis 08/16/2015   Colon cancer screening 07/04/2015   Health care maintenance 12/29/2014   Skin lesion 12/12/2013   Vitamin D deficiency 12/22/2012   Osteopenia 12/22/2012   URI (upper respiratory infection) 12/06/2012   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   Pure hypercholesterolemia 10/08/2012    Past Surgical History:  Procedure Laterality Date   CATARACT EXTRACTION W/PHACO Left 06/28/2020   Procedure: CATARACT EXTRACTION PHACO AND INTRAOCULAR LENS PLACEMENT (IOC) LEFT ISTENT INJ;  Surgeon: Nevada Crane, MD;  Location: [facility];  Service: Ophthalmology;  Laterality: Left;  7.96 0:52.3   CATARACT EXTRACTION W/PHACO Right 08/02/2020   Procedure: CATARACT EXTRACTION PHACO AND INTRAOCULAR LENS PLACEMENT (I[facility]) RIGHT ISTENT INJ;  Surgeon: Nevada Crane, MD;  Location: [facility];  Service:  Ophthalmology;  Laterality: Right;  1.84 0:20.0   COLONOSCOPY W/ POLYPECTOMY     COLONOSCOPY WITH PROPOFOL N/A 03/07/2017   Procedure: COLONOSCOPY WITH PROPOFOL;  Surgeon: Scot Jun, MD;  Location: [facility];  Service: Endoscopy;  Laterality: N/A;   OVARIAN CYST REMOVAL     1969 and 1974    OB History   No obstetric history on file.      Home Medications    Prior to Admission medications   Medication Sig Start Date End Date Taking? Authorizing Provider  predniSONE (STERAPRED UNI-PAK 21 TAB) 10 MG (21) TBPK tablet Take by mouth daily. As directed 07/28/22  Yes Mickie Bail, NP  Calcium Carbonate-Vitamin D (CALTRATE 600+D PO) Take 1 tablet by mouth 2 (two) times daily.    [provider]  cholecalciferol (VITAMIN D) 1000 UNITS tablet Take 1,000 Units by mouth daily.    [provider]  desonide (DESOWEN) 0.05 % ointment at bedtime. 10/29/14   [provider]  dorzolamide-timolol (COSOPT) 22.3-6.8 MG/ML ophthalmic solution PLACE 1 DROP TWICE DAILY TO LEFT EYE 02/24/22   Dale [place], MD  fexofenadine (ALLEGRA) 180 MG tablet Take 180 mg by mouth daily.    [provider]  fluticasone (FLONASE) 50 MCG/ACT nasal spray PLACE 2 SPRAYS DAILY INTO BOTH NOSTRILS. 05/09/22   Dale [place], MD  latanoprost (XALATAN) 0.005 % ophthalmic solution INSTILL ONE DROP IN BOTH EYES AT BEDTIME. 06/18/15   [provider]  meloxicam (  MOBIC) 7.5 MG tablet TAKE 1 TABLET BY MOUTH EVERY DAY AS NEEDED 07/12/22   Dale [place], MD  pyridOXINE (VITAMIN B-6) 100 MG tablet Take 100 mg by mouth daily.    [provider]  rosuvastatin (CRESTOR) 20 MG tablet Take 1 tablet (20 mg total) by mouth daily. 10/21/21   Dale [place], MD    Family History Family History  Problem Relation Age of Onset   Hypertension Mother    Heart disease Mother    Stroke Father    Brain cancer Sister    Breast cancer Neg Hx     Social History Social History    Tobacco Use   Smoking status: Former    Types: Cigarettes    Quit date: 1995    Years since quitting: 28.9   Smokeless tobacco: Never  Vaping Use   Vaping Use: Never used  Substance Use Topics   Alcohol use: Yes    Alcohol/week: 2.0 standard drinks of alcohol    Types: 2 Glasses of wine per week   Drug use: No     Allergies   Patient has no known allergies.   Review of Systems Review of Systems  Constitutional:  Negative for chills and fever.  HENT:  Positive for postnasal drip. Negative for ear pain and sore throat.   Respiratory:  Positive for cough. Negative for shortness of breath.   Cardiovascular:  Negative for chest pain and palpitations.  Gastrointestinal:  Negative for diarrhea and vomiting.  Skin:  Negative for color change and rash.  All other systems reviewed and are negative.    Physical Exam Triage Vital Signs ED Triage Vitals  Enc Vitals Group     BP 07/28/22 1110 112/79     Pulse Rate 07/28/22 1106 87     Resp 07/28/22 1106 18     Temp 07/28/22 1106 97.9 F (36.6 C)     Temp src --      SpO2 07/28/22 1106 91 %     Weight 07/28/22 1109 131 lb (59.4 kg)     Height 07/28/22 1109 5\' 1"  (1.549 m)     Head Circumference --      Peak Flow --      Pain Score 07/28/22 1108 3     Pain Loc --      Pain Edu? --      Excl. in GC? --    No data found.  Updated Vital Signs BP 112/79   Pulse 87   Temp 97.9 F (36.6 C)   Resp 18   Ht 5\' 1"  (1.549 m)   Wt 131 lb (59.4 kg)   SpO2 91%   BMI 24.75 kg/m   Visual Acuity Right Eye Distance:   Left Eye Distance:   Bilateral Distance:    Right Eye Near:   Left Eye Near:    Bilateral Near:     Physical Exam Vitals and nursing note reviewed.  Constitutional:      General: She is not in acute distress.    Appearance: Normal appearance. She is well-developed. She is not ill-appearing.  HENT:     Right Ear: Tympanic membrane normal.     Left Ear: Tympanic membrane normal.     Nose: Nose normal.      Mouth/Throat:     Mouth: Mucous membranes are moist.     Pharynx: Oropharynx is clear.     Comments: Clear PND. Cardiovascular:     Rate and Rhythm: Normal rate and regular  rhythm.     Heart sounds: Normal heart sounds.  Pulmonary:     Effort: Pulmonary effort is normal. No respiratory distress.     Breath sounds: Normal breath sounds.  Musculoskeletal:     Cervical back: Neck supple.  Skin:    General: Skin is warm and dry.  Neurological:     Mental Status: She is alert.  Psychiatric:        Mood and Affect: Mood normal.        Behavior: Behavior normal.      UC Treatments / Results  Labs (all labs ordered are listed, but only abnormal results are displayed) Labs Reviewed - No data to display  EKG   Radiology No results found.  Procedures Procedures (including critical care time)  Medications Ordered in UC Medications - No data to display  Initial Impression / Assessment and Plan / UC Course  I have reviewed the triage vital signs and the nursing notes.  Pertinent labs & imaging results that were available during my care of the patient were reviewed by me and considered in my medical decision making (see chart for details).   Cough, postnasal drip.  Patient is well-appearing and her exam is reassuring.  Treating with prednisone as patient reports this worked well for her in the past.  Instructed patient to follow up with her PCP if her symptoms are not improving.  Education provided on cough and postnasal drip.  She agrees to plan of care.     Final Clinical Impressions(s) / UC Diagnoses   Final diagnoses:  Acute cough  Postnasal drip     Discharge Instructions      Take the prednisone as directed.  Follow up with your primary care provider if your symptoms are not improving.        ED Prescriptions     Medication Sig Dispense Auth. Provider   predniSONE (STERAPRED UNI-PAK 21 TAB) 10 MG (21) TBPK tablet Take by mouth daily. As directed 21  tablet Mickie Bail, NP      PDMP not reviewed this encounter.   Mickie Bail, NP 07/28/22 1147

## 2022-07-28 NOTE — ED Triage Notes (Signed)
Patient to Urgent Care with complaints of dry cough and post-nasal drip x7 days. Denies any known fevers.   Has been taking nyquil.

## 2022-08-02 ENCOUNTER — Ambulatory Visit: Payer: Federal, State, Local not specified - PPO

## 2022-08-02 MED ORDER — AMOXICILLIN 875 MG PO TABS
875.0000 mg | ORAL_TABLET | Freq: Two times a day (BID) | ORAL | 0 refills | Status: AC
Start: 2022-08-02 — End: 2022-08-09

## 2022-08-02 NOTE — Telephone Encounter (Signed)
Patient contacted Urgent Care regarding worsening cough and symptoms after completion of prescribed prednisone. Medications discussed with Wendee Beavers NP.   See order for 7 day course of amoxicillin 875mg  sent to patient's pharmacy of choice.   Patient's questions answered, verbalized understanding of medications.

## 2022-09-05 ENCOUNTER — Other Ambulatory Visit (INDEPENDENT_AMBULATORY_CARE_PROVIDER_SITE_OTHER): Payer: Federal, State, Local not specified - PPO

## 2022-09-05 DIAGNOSIS — E7800 Pure hypercholesterolemia, unspecified: Secondary | ICD-10-CM | POA: Diagnosis not present

## 2022-09-05 LAB — BASIC METABOLIC PANEL
BUN: 30 mg/dL — ABNORMAL HIGH (ref 6–23)
CO2: 30 mEq/L (ref 19–32)
Calcium: 9.2 mg/dL (ref 8.4–10.5)
Chloride: 105 mEq/L (ref 96–112)
Creatinine, Ser: 0.77 mg/dL (ref 0.40–1.20)
GFR: 73.94 mL/min (ref >60.00)
Glucose, Bld: 91 mg/dL (ref 70–99)
Potassium: 4.4 mEq/L (ref 3.5–5.1)
Sodium: 143 mEq/L (ref 135–145)

## 2022-09-05 LAB — HEPATIC FUNCTION PANEL
ALT: 22 U/L (ref 0–35)
AST: 23 U/L (ref 0–37)
Albumin: 4 g/dL (ref 3.5–5.2)
Alkaline Phosphatase: 56 U/L (ref 39–117)
Bilirubin, Direct: 0.1 mg/dL (ref 0.0–0.3)
Total Bilirubin: 0.5 mg/dL (ref 0.2–1.2)
Total Protein: 6.4 g/dL (ref 6.0–8.3)

## 2022-09-05 LAB — LIPID PANEL
Cholesterol: 176 mg/dL (ref 0–200)
HDL: 88.8 mg/dL (ref >39.00)
LDL Cholesterol: 76 mg/dL (ref 0–99)
NonHDL: 87.12
Total CHOL/HDL Ratio: 2
Triglycerides: 56 mg/dL (ref 0.0–149.0)
VLDL: 11.2 mg/dL (ref 0.0–40.0)

## 2022-09-07 ENCOUNTER — Encounter: Payer: Self-pay | Admitting: Internal Medicine

## 2022-09-07 ENCOUNTER — Ambulatory Visit (INDEPENDENT_AMBULATORY_CARE_PROVIDER_SITE_OTHER): Payer: Federal, State, Local not specified - PPO | Admitting: Internal Medicine

## 2022-09-07 ENCOUNTER — Telehealth: Payer: Self-pay

## 2022-09-07 VITALS — BP 130/80 | HR 80 | Temp 97.9°F | Resp 14 | Ht 61.0 in | Wt 130.0 lb

## 2022-09-07 DIAGNOSIS — E7800 Pure hypercholesterolemia, unspecified: Secondary | ICD-10-CM | POA: Diagnosis not present

## 2022-09-07 DIAGNOSIS — Z1211 Encounter for screening for malignant neoplasm of colon: Secondary | ICD-10-CM

## 2022-09-07 DIAGNOSIS — E2839 Other primary ovarian failure: Secondary | ICD-10-CM

## 2022-09-07 DIAGNOSIS — Z0000 Encounter for general adult medical examination without abnormal findings: Secondary | ICD-10-CM | POA: Diagnosis not present

## 2022-09-07 DIAGNOSIS — R030 Elevated blood-pressure reading, without diagnosis of hypertension: Secondary | ICD-10-CM | POA: Diagnosis not present

## 2022-09-07 DIAGNOSIS — K219 Gastro-esophageal reflux disease without esophagitis: Secondary | ICD-10-CM

## 2022-09-07 DIAGNOSIS — R059 Cough, unspecified: Secondary | ICD-10-CM

## 2022-09-07 MED ORDER — ROSUVASTATIN CALCIUM 20 MG PO TABS: 20.0000 mg | ORAL_TABLET | Freq: Every day | ORAL | 3 refills | Status: DC

## 2022-09-07 NOTE — Patient Instructions (Signed)
Pepcid (famotidine) - 20mg  - take 30 minutes before bed

## 2022-09-07 NOTE — Progress Notes (Signed)
Subjective:    Patient ID: Vickie Crawford, female    DOB: 09-18-1945, 77 y.o.   MRN: [id number]  Patient here for  Chief Complaint  Patient presents with   Annual Exam    CPE    HPI Here for a physical exam. Was evaluated 07/28/22 - cough.  Treated with prednisone.  Has essentially resolved.  Not significant symptoms now.  No increased congestion or increased cough.  No chest pain or sob.  Has noticed some increased acid reflux.  No abdominal pain.  Bowels moving.     Past Medical History:  Diagnosis Date   Allergy    Arthritis    fingers, toes   Glaucoma    Hypercholesterolemia    Motion sickness    Rough ocean waters   Osteopenia    Ovarian cyst    Hospitalized 1969 and 1974 for removal   Thyroid nodule    Vitamin D deficiency    Past Surgical History:  Procedure Laterality Date   CATARACT EXTRACTION W/PHACO Left 06/28/2020   Procedure: CATARACT EXTRACTION PHACO AND INTRAOCULAR LENS PLACEMENT ([place]) LEFT ISTENT INJ;  Surgeon: Eulogio Bear, MD;  Location: [facility];  Service: Ophthalmology;  Laterality: Left;  7.96 0:52.3   CATARACT EXTRACTION W/PHACO Right 08/02/2020   Procedure: CATARACT EXTRACTION PHACO AND INTRAOCULAR LENS PLACEMENT ([place]) RIGHT ISTENT INJ;  Surgeon: Eulogio Bear, MD;  Location: [place];  Service: Ophthalmology;  Laterality: Right;  1.84 0:20.0   COLONOSCOPY W/ POLYPECTOMY     COLONOSCOPY WITH PROPOFOL N/A 03/07/2017   Procedure: COLONOSCOPY WITH PROPOFOL;  Surgeon: Manya Silvas, MD;  [facility];  Service: Endoscopy;  Laterality: N/A;   OVARIAN CYST REMOVAL     1969 and 1974   Family History  Problem Relation Age of Onset   Hypertension Mother    Heart disease Mother    Stroke Father    Brain cancer Sister    Breast cancer Neg Hx    Social History   Socioeconomic History   Marital status: Married    Spouse name: Not on file   Number of children: 2   Years of education: Not on file   Highest  education level: Not on file  Occupational History   Not on file  Tobacco Use   Smoking status: Former    Types: Cigarettes    Quit date: 1995    Years since quitting: 29.0   Smokeless tobacco: Never  Vaping Use   Vaping Use: Never used  Substance and Sexual Activity   Alcohol use: Yes    Alcohol/week: 2.0 standard drinks of alcohol    Types: 2 Glasses of wine per week   Drug use: No   Sexual activity: Not on file  Other Topics Concern   Not on file  Social History Narrative   Not on file   Social Determinants of Health   Financial Resource Strain: Not on file  Food Insecurity: Not on file  Transportation Needs: Not on file  Physical Activity: Not on file  Stress: Not on file  Social Connections: Not on file     Review of Systems  Constitutional:  Negative for appetite change and unexpected weight change.  HENT:  Negative for congestion, sinus pressure and sore throat.   Eyes:  Negative for pain and visual disturbance.  Respiratory:  Negative for cough, chest tightness and shortness of breath.   Cardiovascular:  Negative for chest pain, palpitations and leg swelling.  Gastrointestinal:  Negative for abdominal pain, diarrhea, nausea and vomiting.  Genitourinary:  Negative for difficulty urinating and dysuria.  Musculoskeletal:  Negative for joint swelling and myalgias.  Skin:  Negative for color change and rash.  Neurological:  Negative for dizziness and headaches.  Hematological:  Negative for adenopathy. Does not bruise/bleed easily.  Psychiatric/Behavioral:  Negative for agitation and dysphoric mood.        Objective:     BP 130/80 (BP Location: Left Arm, Patient Position: Sitting, Cuff Size: Small)   Pulse 80   Temp 97.9 F (36.6 C) (Temporal)   Resp 14   Ht 5\' 1"  (1.549 m)   Wt 130 lb (59 kg)   SpO2 98%   BMI 24.56 kg/m  Wt Readings from Last 3 Encounters:  09/07/22 130 lb (59 kg)  07/28/22 131 lb (59.4 kg)  04/20/22 130 lb 6.4 oz (59.1 kg)     Physical Exam Vitals reviewed.  Constitutional:      General: She is not in acute distress.    Appearance: Normal appearance. She is well-developed.  HENT:     Head: Normocephalic and atraumatic.     Right Ear: External ear normal.     Left Ear: External ear normal.  Eyes:     General: No scleral icterus.       Right eye: No discharge.        Left eye: No discharge.     Conjunctiva/sclera: Conjunctivae normal.  Neck:     Thyroid: No thyromegaly.  Cardiovascular:     Rate and Rhythm: Normal rate and regular rhythm.  Pulmonary:     Effort: No tachypnea, accessory muscle usage or respiratory distress.     Breath sounds: Normal breath sounds. No decreased breath sounds or wheezing.  Chest:  Breasts:    Right: No inverted nipple, mass, nipple discharge or tenderness (no axillary adenopathy).     Left: No inverted nipple, mass, nipple discharge or tenderness (no axilarry adenopathy).  Abdominal:     General: Bowel sounds are normal.     Palpations: Abdomen is soft.     Tenderness: There is no abdominal tenderness.  Musculoskeletal:        General: No swelling or tenderness.     Cervical back: Neck supple.  Lymphadenopathy:     Cervical: No cervical adenopathy.  Skin:    Findings: No erythema or rash.  Neurological:     Mental Status: She is alert and oriented to person, place, and time.  Psychiatric:        Mood and Affect: Mood normal.        Behavior: Behavior normal.      Outpatient Encounter Medications as of 09/07/2022  Medication Sig   Calcium Carbonate-Vitamin D (CALTRATE 600+D PO) Take 1 tablet by mouth 2 (two) times daily.   cholecalciferol (VITAMIN D) 1000 UNITS tablet Take 1,000 Units by mouth daily.   desonide (DESOWEN) 0.05 % ointment at bedtime.   dorzolamide-timolol (COSOPT) 22.3-6.8 MG/ML ophthalmic solution PLACE 1 DROP TWICE DAILY TO LEFT EYE   fexofenadine (ALLEGRA) 180 MG tablet Take 180 mg by mouth daily.   fluticasone (FLONASE) 50 MCG/ACT nasal  spray PLACE 2 SPRAYS DAILY INTO BOTH NOSTRILS.   latanoprost (XALATAN) 0.005 % ophthalmic solution INSTILL ONE DROP IN BOTH EYES AT BEDTIME.   meloxicam (MOBIC) 7.5 MG tablet TAKE 1 TABLET BY MOUTH EVERY DAY AS NEEDED   pyridOXINE (VITAMIN B-6) 100 MG tablet Take 100 mg by mouth daily.   [DISCONTINUED] rosuvastatin (CRESTOR)  20 MG tablet Take 1 tablet (20 mg total) by mouth daily.   rosuvastatin (CRESTOR) 20 MG tablet Take 1 tablet (20 mg total) by mouth daily.   [DISCONTINUED] predniSONE (STERAPRED UNI-PAK 21 TAB) 10 MG (21) TBPK tablet Take by mouth daily. As directed   No facility-administered encounter medications on file as of 09/07/2022.     Lab Results  Component Value Date   WBC 4.9 04/17/2022   HGB 14.5 04/17/2022   HCT 43.5 04/17/2022   PLT 214.0 04/17/2022   GLUCOSE 91 09/05/2022   CHOL 176 09/05/2022   TRIG 56.0 09/05/2022   HDL 88.80 09/05/2022   LDLCALC 76 09/05/2022   ALT 22 09/05/2022   AST 23 09/05/2022   NA 143 09/05/2022   K 4.4 09/05/2022   CL 105 09/05/2022   CREATININE 0.77 09/05/2022   BUN 30 (H) 09/05/2022   CO2 30 09/05/2022   TSH 1.85 04/17/2022    No results found.     Assessment & Plan:  Routine general medical examination at a health care facility  Health care maintenance Assessment & Plan: Physical today 09/07/22.  Colonoscopy 02/2017- recommended f/u in 10 years.  Mammogram 03/07/22 - Birads I.  Schedule bone density.    Pure hypercholesterolemia Assessment & Plan: On simvastatin.  Low cholesterol diet and exercise.  Follow lipid panel and liver function tests.    Orders: -     Lipid panel; Future -     Hepatic function panel; Future  Elevated blood pressure reading Assessment & Plan: Reviewed outside blood pressure readings.  Blood pressures 107-126/60-70s.  Follow.   Orders: -     Basic metabolic panel; Future  Estrogen deficiency -     DG Bone Density; Future  Colon cancer screening Assessment & Plan: Colonoscopy 02/2017.   Recommended f/u colonoscopy in 10 years.     Cough, unspecified type Assessment & Plan: Recent infection.  Essentially resolved.  Follow.    Gastroesophageal reflux disease, unspecified whether esophagitis present Assessment & Plan: Some reflux reported as outlined.  Trial of pepcid.  Follow.  Call with update.    Other orders -     Rosuvastatin Calcium; Take 1 tablet (20 mg total) by mouth daily.  Dispense: 90 tablet; Refill: 3     Dale , MD

## 2022-09-07 NOTE — Telephone Encounter (Signed)
Mychart msg sent re: scheduled bone density

## 2022-09-07 NOTE — Assessment & Plan Note (Addendum)
Physical today 09/07/22.  Colonoscopy 02/2017- recommended f/u in 10 years.  Mammogram 03/07/22 - Birads I.  Schedule bone density.

## 2022-09-10 ENCOUNTER — Encounter: Payer: Self-pay | Admitting: Internal Medicine

## 2022-09-10 DIAGNOSIS — K219 Gastro-esophageal reflux disease without esophagitis: Secondary | ICD-10-CM | POA: Insufficient documentation

## 2022-09-10 NOTE — Assessment & Plan Note (Signed)
Some reflux reported as outlined.  Trial of pepcid.  Follow.  Call with update.

## 2022-09-10 NOTE — Assessment & Plan Note (Signed)
Reviewed outside blood pressure readings.  Blood pressures 107-126/60-70s.  Follow.

## 2022-09-10 NOTE — Assessment & Plan Note (Signed)
On simvastatin.  Low cholesterol diet and exercise.  Follow lipid panel and liver function tests.   

## 2022-09-10 NOTE — Assessment & Plan Note (Signed)
Recent infection.  Essentially resolved.  Follow.

## 2022-09-10 NOTE — Assessment & Plan Note (Signed)
Colonoscopy 02/2017.  Recommended f/u colonoscopy in 10 years.

## 2022-10-14 ENCOUNTER — Other Ambulatory Visit: Payer: Self-pay | Admitting: Internal Medicine

## 2022-10-26 ENCOUNTER — Ambulatory Visit
Admission: RE | Admit: 2022-10-26 | Discharge: 2022-10-26 | Disposition: A | Discharge status: Home / Self Care | Payer: Federal, State, Local not specified - PPO | Source: Ambulatory Visit | Attending: Internal Medicine | Admitting: Internal Medicine

## 2022-10-26 DIAGNOSIS — E2839 Other primary ovarian failure: Secondary | ICD-10-CM

## 2022-11-10 ENCOUNTER — Ambulatory Visit: Payer: Federal, State, Local not specified - PPO | Admitting: Internal Medicine

## 2022-11-16 ENCOUNTER — Ambulatory Visit: Payer: Federal, State, Local not specified - PPO | Admitting: Internal Medicine

## 2022-11-16 ENCOUNTER — Encounter: Payer: Self-pay | Admitting: Internal Medicine

## 2022-11-16 VITALS — BP 122/78 | HR 88 | Temp 98.3°F | Resp 16 | Ht 61.0 in | Wt 129.4 lb

## 2022-11-16 DIAGNOSIS — E7800 Pure hypercholesterolemia, unspecified: Secondary | ICD-10-CM | POA: Diagnosis not present

## 2022-11-16 DIAGNOSIS — R030 Elevated blood-pressure reading, without diagnosis of hypertension: Secondary | ICD-10-CM | POA: Diagnosis not present

## 2022-11-16 DIAGNOSIS — K219 Gastro-esophageal reflux disease without esophagitis: Secondary | ICD-10-CM | POA: Diagnosis not present

## 2022-11-16 DIAGNOSIS — Z1211 Encounter for screening for malignant neoplasm of colon: Secondary | ICD-10-CM | POA: Diagnosis not present

## 2022-11-16 DIAGNOSIS — M8580 Other specified disorders of bone density and structure, unspecified site: Secondary | ICD-10-CM

## 2022-11-16 DIAGNOSIS — E559 Vitamin D deficiency, unspecified: Secondary | ICD-10-CM

## 2022-11-16 NOTE — Progress Notes (Signed)
Subjective:    Patient ID: Vickie Crawford, female    DOB: [birth year], 77 y.o.   MRN: [id number]  Patient here for  Chief Complaint  Patient presents with   Medical Management of Chronic Issues    HPI Here to follow up regarding her cholesterol and blood pressure.  Reports she is doing relatively well.  Tries to stay active.  No chest pain or sob reported.  No abdominal pain or bowel change reported.  Last visit, reported some acid reflux.  Discussed trial of pepcid.  Doing better now.  Discussed bone density resutls.     Past Medical History:  Diagnosis Date   Allergy    Arthritis    fingers, toes   Glaucoma    Hypercholesterolemia    Motion sickness    Rough ocean waters   Osteopenia    Ovarian cyst    Hospitalized 1969 and 1974 for removal   Thyroid nodule    Vitamin D deficiency    Past Surgical History:  Procedure Laterality Date   CATARACT EXTRACTION W/PHACO Left 06/28/2020   Procedure: CATARACT EXTRACTION PHACO AND INTRAOCULAR LENS PLACEMENT ([place]) LEFT ISTENT INJ;  Surgeon: Eulogio Bear, MD;  Location: [place];  Service: Ophthalmology;  Laterality: Left;  7.96 0:52.3   CATARACT EXTRACTION W/PHACO Right 08/02/2020   Procedure: CATARACT EXTRACTION PHACO AND INTRAOCULAR LENS PLACEMENT ([place]) RIGHT ISTENT INJ;  Surgeon: Eulogio Bear, MD;  Location: [place];  Service: Ophthalmology;  Laterality: Right;  1.84 0:20.0   COLONOSCOPY W/ POLYPECTOMY     COLONOSCOPY WITH PROPOFOL N/A 03/07/2017   Procedure: COLONOSCOPY WITH PROPOFOL;  Surgeon: Manya Silvas, MD;  Location[facility] END[facility]COPY;  Service: Endoscopy;  Laterality: N/A;   OVARIAN CYST REMOVAL     1969 and 1974   Family History  Problem Relation Age of Onset   Hypertension Mother    Heart disease Mother    Stroke Father    Brain cancer Sister    Breast cancer Neg Hx    Social History   Socioeconomic History   Marital status: Married    Spouse name: Not on file   Number of  children: 2   Years of education: Not on file   Highest education level: Not on file  Occupational History   Not on file  Tobacco Use   Smoking status: Former    Types: Cigarettes    Quit date: 1995    Years since quitting: 29.2   Smokeless tobacco: Never  Vaping Use   Vaping Use: Never used  Substance and Sexual Activity   Alcohol use: Yes    Alcohol/week: 2.0 standard drinks of alcohol    Types: 2 Glasses of wine per week   Drug use: No   Sexual activity: Not on file  Other Topics Concern   Not on file  Social History Narrative   Not on file   Social Determinants of Health   Financial Resource Strain: Not on file  Food Insecurity: Not on file  Transportation Needs: Not on file  Physical Activity: Not on file  Stress: Not on file  Social Connections: Not on file     Review of Systems  Constitutional:  Negative for appetite change and unexpected weight change.  HENT:  Negative for congestion and sinus pressure.   Respiratory:  Negative for cough, chest tightness and shortness of breath.   Cardiovascular:  Negative for chest pain and palpitations.  Gastrointestinal:  Negative for abdominal pain, diarrhea,  nausea and vomiting.  Genitourinary:  Negative for difficulty urinating and dysuria.  Musculoskeletal:  Negative for joint swelling and myalgias.  Skin:  Negative for color change and rash.  Neurological:  Negative for dizziness and headaches.  Psychiatric/Behavioral:  Negative for agitation and dysphoric mood.        Objective:     BP 122/78   Pulse 88   Temp 98.3 F (36.8 C)   Resp 16   Ht 5\' 1"  (1.549 m)   Wt 129 lb 6.4 oz (58.7 kg)   SpO2 98%   BMI 24.45 kg/m  Wt Readings from Last 3 Encounters:  11/16/22 129 lb 6.4 oz (58.7 kg)  09/07/22 130 lb (59 kg)  07/28/22 131 lb (59.4 kg)    Physical Exam Vitals reviewed.  Constitutional:      General: She is not in acute distress.    Appearance: Normal appearance.  HENT:     Head: Normocephalic  and atraumatic.     Right Ear: External ear normal.     Left Ear: External ear normal.  Eyes:     General: No scleral icterus.       Right eye: No discharge.        Left eye: No discharge.     Conjunctiva/sclera: Conjunctivae normal.  Neck:     Thyroid: No thyromegaly.  Cardiovascular:     Rate and Rhythm: Normal rate and regular rhythm.  Pulmonary:     Effort: No respiratory distress.     Breath sounds: Normal breath sounds. No wheezing.  Abdominal:     General: Bowel sounds are normal.     Palpations: Abdomen is soft.     Tenderness: There is no abdominal tenderness.  Musculoskeletal:        General: No swelling or tenderness.     Cervical back: Neck supple. No tenderness.  Lymphadenopathy:     Cervical: No cervical adenopathy.  Skin:    Findings: No erythema or rash.  Neurological:     Mental Status: She is alert.  Psychiatric:        Mood and Affect: Mood normal.        Behavior: Behavior normal.      Outpatient Encounter Medications as of 11/16/2022  Medication Sig   Calcium Carbonate-Vitamin D (CALTRATE 600+D PO) Take 1 tablet by mouth 2 (two) times daily.   cholecalciferol (VITAMIN D) 1000 UNITS tablet Take 1,000 Units by mouth daily.   desonide (DESOWEN) 0.05 % ointment at bedtime.   dorzolamide-timolol (COSOPT) 22.3-6.8 MG/ML ophthalmic solution PLACE 1 DROP TWICE DAILY TO LEFT EYE   fexofenadine (ALLEGRA) 180 MG tablet Take 180 mg by mouth daily.   fluticasone (FLONASE) 50 MCG/ACT nasal spray PLACE 2 SPRAYS DAILY INTO BOTH NOSTRILS.   latanoprost (XALATAN) 0.005 % ophthalmic solution INSTILL ONE DROP IN BOTH EYES AT BEDTIME.   meloxicam (MOBIC) 7.5 MG tablet TAKE 1 TABLET BY MOUTH EVERY DAY AS NEEDED   pyridOXINE (VITAMIN B-6) 100 MG tablet Take 100 mg by mouth daily.   rosuvastatin (CRESTOR) 20 MG tablet Take 1 tablet (20 mg total) by mouth daily.   No facility-administered encounter medications on file as of 11/16/2022.     Lab Results  Component Value  Date   WBC 4.9 04/17/2022   HGB 14.5 04/17/2022   HCT 43.5 04/17/2022   PLT 214.0 04/17/2022   GLUCOSE 91 09/05/2022   CHOL 176 09/05/2022   TRIG 56.0 09/05/2022   HDL 88.80 09/05/2022   LDLCALC 76  09/05/2022   ALT 22 09/05/2022   AST 23 09/05/2022   NA 143 09/05/2022   K 4.4 09/05/2022   CL 105 09/05/2022   CREATININE 0.77 09/05/2022   BUN 30 (H) 09/05/2022   CO2 30 09/05/2022   TSH 1.85 04/17/2022    DG Bone Density  Result Date: 10/26/2022 EXAM: DUAL X-RAY ABSORPTIOMETRY (DXA) FOR BONE MINERAL DENSITY IMPRESSION: Your patient Tatyona Yingling completed a BMD test on 10/26/2022 using the [place] (software version: 14.10) manufactured by [employer]. The following summarizes the results of our evaluation. Technologist:VLM PATIENT BIOGRAPHICAL: Name: Averey, Tonjes Patient ID: [id number] Birth Date: [birth year] Height: 61.0 in. Gender: Female Exam Date: 10/26/2022 Weight: 130.0 lbs. Indications: Caucasian, Postmenopausal Fractures: Treatments: calcium w/ vit D DENSITOMETRY RESULTS: Site         Region     Measured Date Measured Age WHO Classification Young Adult T-score BMD         %Change vs. Previous Significant Change (*) AP Spine L1-L2 10/26/2020 78.4 Osteopenia -2.2 0.909 g/cm2 - - Left Forearm Radius 33% 10/26/2022 78.4 Osteopenia -1.6 0.733 g/cm2 - - DualFemur Neck Left 10/26/2022 78.4 Osteopenia -1.5 0.827 g/cm2 - - ASSESSMENT: The BMD measured at AP Spine L1-L2 is 0.909 g/cm2 with a T-score of -2.2. This patient is considered osteopenic according to [facility]) criteria. L-3 & L-4 were excluded due to degenerative changes. The scan quality is good. World Pharmacologist [facility]) criteria for post-menopausal, Caucasian Women: Normal:                   T-score at or above -1 SD Osteopenia/low bone mass: T-score between -1 and -2.5 SD Osteoporosis:             T-score at or below -2.5 SD RECOMMENDATIONS: 1. All patients should optimize calcium and  vitamin D intake. 2. Consider FDA-approved medical therapies in postmenopausal women and men aged 48 years and older, based on the following: a. A hip or vertebral(clinical or morphometric) fracture b. T-score < -2.5 at the femoral neck or spine after appropriate evaluation to exclude secondary causes c. Low bone mass (T-score between -1.0 and -2.5 at the femoral neck or spine) and a 10-year probability of a hip fracture > 3% or a 10-year probability of a major osteoporosis-related fracture > 20% based on the [place]-adapted WHO algorithm 3. Clinician judgment and/or patient preferences may indicate treatment for people with 10-year fracture probabilities above or below these levels FOLLOW-UP: People with diagnosed cases of osteoporosis or at high risk for fracture should have regular bone mineral density tests. For patients eligible for Medicare, routine testing is allowed once every 2 years. The testing frequency can be increased to one year for patients who have rapidly progressing disease, those who are receiving or discontinuing medical therapy to restore bone mass, or have additional risk factors. I have reviewed this report, and agree with the above findings. [facility] Dear Einar Pheasant, Your patient NEAVE SHAMES completed a FRAX assessment on 10/26/2022 using the [place] (analysis version: 14.10) manufactured by [employer]. The following summarizes the results of our evaluation. PATIENT BIOGRAPHICAL: Name: Osie, East Patient ID: [id number] Birth Date: 09-06-1945 Height:    61.0 in. Gender:     Female    [age]       Weight:    130.0 lbs. Ethnicity:  White  Exam Date: 10/26/2022 FRAX* RESULTS:  (version: 3.5) 10-year Probability of Fracture1 Major Osteoporotic Fracture2 Hip Fracture 13.0% 3.0% Population: [place] (Caucasian) Risk Factors: None Based on Femur (Left) Neck BMD 1 -The 10-year probability of fracture may be lower than reported if the  patient has received treatment. 2 -Major Osteoporotic Fracture: Clinical Spine, Forearm, Hip or Shoulder *FRAX is a Materials engineer of the [employer] of [employer] for Metabolic Bone Disease, a [place] (WHO) [facility]. ASSESSMENT: The probability of a major osteoporotic fracture is 13.0% within the next ten years. The probability of a hip fracture is 3.0% within the next ten years. . Electronically Signed   By: Franki Cabot M.D.   On: 10/26/2022 10:08       Assessment & Plan:  Hypercholesterolemia Assessment & Plan: On crestor now.  Continue diet and exercise.  Follow lipid panel and liver function tests.     Colon cancer screening Assessment & Plan: Colonoscopy 02/2017.  Recommended f/u colonoscopy in 10 years.     Elevated blood pressure reading Assessment & Plan: Blood pressure recheck 122/78.  Follow.     Gastroesophageal reflux disease, unspecified whether esophagitis present Assessment & Plan: Previously reported acid reflux as outlined.  Trial of pepcid.  Not reported as an issue today.  Follow.    Osteopenia, unspecified location Assessment & Plan: Discussed recent bone density results.  Continue calcium, vitamin D and weight bearing exercise.     Vitamin D deficiency Assessment & Plan: Continue vitamin D supplements.       Einar Pheasant, MD

## 2022-11-26 ENCOUNTER — Encounter: Payer: Self-pay | Admitting: Internal Medicine

## 2022-11-26 DIAGNOSIS — E7800 Pure hypercholesterolemia, unspecified: Secondary | ICD-10-CM | POA: Insufficient documentation

## 2022-11-26 NOTE — Assessment & Plan Note (Signed)
Continue vitamin D supplements.  

## 2022-11-26 NOTE — Assessment & Plan Note (Signed)
Blood pressure recheck 122/78.  Follow.

## 2022-11-26 NOTE — Assessment & Plan Note (Signed)
Discussed recent bone density results.  Continue calcium, vitamin D and weight bearing exercise.

## 2022-11-26 NOTE — Assessment & Plan Note (Signed)
On crestor now.  Continue diet and exercise.  Follow lipid panel and liver function tests.

## 2022-11-26 NOTE — Assessment & Plan Note (Signed)
Previously reported acid reflux as outlined.  Trial of pepcid.  Not reported as an issue today.  Follow.

## 2022-11-26 NOTE — Assessment & Plan Note (Signed)
Colonoscopy 02/2017.  Recommended f/u colonoscopy in 10 years.   

## 2023-01-02 ENCOUNTER — Other Ambulatory Visit: Payer: Self-pay | Admitting: Internal Medicine

## 2023-02-15 ENCOUNTER — Other Ambulatory Visit: Payer: Federal, State, Local not specified - PPO

## 2023-02-19 ENCOUNTER — Other Ambulatory Visit (INDEPENDENT_AMBULATORY_CARE_PROVIDER_SITE_OTHER): Payer: Federal, State, Local not specified - PPO

## 2023-02-19 DIAGNOSIS — R030 Elevated blood-pressure reading, without diagnosis of hypertension: Secondary | ICD-10-CM | POA: Diagnosis not present

## 2023-02-19 DIAGNOSIS — E7800 Pure hypercholesterolemia, unspecified: Secondary | ICD-10-CM | POA: Diagnosis not present

## 2023-02-19 LAB — BASIC METABOLIC PANEL
BUN: 21 mg/dL (ref 6–23)
CO2: 29 mEq/L (ref 19–32)
Calcium: 9.3 mg/dL (ref 8.4–10.5)
Chloride: 106 mEq/L (ref 96–112)
Creatinine, Ser: 0.69 mg/dL (ref 0.40–1.20)
GFR: 82.92 mL/min (ref >60.00)
Glucose, Bld: 98 mg/dL (ref 70–99)
Potassium: 4.4 mEq/L (ref 3.5–5.1)
Sodium: 140 mEq/L (ref 135–145)

## 2023-02-19 LAB — HEPATIC FUNCTION PANEL
ALT: 24 U/L (ref 0–35)
AST: 26 U/L (ref 0–37)
Albumin: 4 g/dL (ref 3.5–5.2)
Alkaline Phosphatase: 52 U/L (ref 39–117)
Bilirubin, Direct: 0.1 mg/dL (ref 0.0–0.3)
Total Bilirubin: 0.6 mg/dL (ref 0.2–1.2)
Total Protein: 6.6 g/dL (ref 6.0–8.3)

## 2023-02-19 LAB — LIPID PANEL
Cholesterol: 184 mg/dL (ref 0–200)
HDL: 96.3 mg/dL (ref >39.00)
LDL Cholesterol: 76 mg/dL (ref 0–99)
NonHDL: 87.2
Total CHOL/HDL Ratio: 2
Triglycerides: 58 mg/dL (ref 0.0–149.0)
VLDL: 11.6 mg/dL (ref 0.0–40.0)

## 2023-02-19 NOTE — Progress Notes (Signed)
Subjective:    Patient ID: Vickie Crawford, female    DOB: Aug 22, 1946, 77 y.o.   MRN: [id number]  Patient here for  Chief Complaint  Patient presents with   Medical Management of Chronic Issues    HPI Here to follow up regarding her cholesterol and blood pressure.  She is doing well.  Reviewed outside blood pressures.  Most averaging 110-120/70s.  Stays active.  Playing golf.  Traveling.  No chest pain or sob reported.  No cough or congestion.  No abdominal pain or bowel change.     Past Medical History:  Diagnosis Date   Allergy    Arthritis    fingers, toes   Glaucoma    Hypercholesterolemia    Motion sickness    Rough ocean waters   Osteopenia    Ovarian cyst    Hospitalized 1969 and 1974 for removal   Thyroid nodule    Vitamin D deficiency    Past Surgical History:  Procedure Laterality Date   CATARACT EXTRACTION W/PHACO Left 06/28/2020   Procedure: CATARACT EXTRACTION PHACO AND INTRAOCULAR LENS PLACEMENT ([place]) LEFT ISTENT INJ;  Surgeon: Nevada Crane, MD;  Location: [facility];  Service: Ophthalmology;  Laterality: Left;  7.96 0:52.3   CATARACT EXTRACTION W/PHACO Right 08/02/2020   Procedure: CATARACT EXTRACTION PHACO AND INTRAOCULAR LENS PLACEMENT ([facility]) RIGHT ISTENT INJ;  Surgeon: Nevada Crane, MD;  Location: [facility];  Service: Ophthalmology;  Laterality: Right;  1.84 0:20.0   COLONOSCOPY W/ POLYPECTOMY     COLONOSCOPY WITH PROPOFOL N/A 03/07/2017   Procedure: COLONOSCOPY WITH PROPOFOL;  Surgeon: Scot Jun, MD;  Location: [facility] END[facility];  Service: Endoscopy;  Laterality: N/A;   OVARIAN CYST REMOVAL     1969 and 1974   Family History  Problem Relation Age of Onset   Hypertension Mother    Heart disease Mother    Stroke Father    Brain cancer Sister    Breast cancer Neg Hx    Social History   Socioeconomic History   Marital status: Married    Spouse name: Not on file   Number of children: 2   Years of education: Not on  file   Highest education level: Not on file  Occupational History   Not on file  Tobacco Use   Smoking status: Former    Types: Cigarettes    Quit date: 1995    Years since quitting: 29.5   Smokeless tobacco: Never  Vaping Use   Vaping Use: Never used  Substance and Sexual Activity   Alcohol use: Yes    Alcohol/week: 2.0 standard drinks of alcohol    Types: 2 Glasses of wine per week   Drug use: No   Sexual activity: Not on file  Other Topics Concern   Not on file  Social History Narrative   Not on file   Social Determinants of Health   Financial Resource Strain: Not on file  Food Insecurity: Not on file  Transportation Needs: Not on file  Physical Activity: Not on file  Stress: Not on file  Social Connections: Not on file     Review of Systems  Constitutional:  Negative for appetite change and unexpected weight change.  HENT:  Negative for congestion and sinus pressure.   Respiratory:  Negative for cough, chest tightness and shortness of breath.   Cardiovascular:  Negative for chest pain, palpitations and leg swelling.  Gastrointestinal:  Negative for abdominal pain, diarrhea, nausea and vomiting.  Genitourinary:  Negative for difficulty urinating and dysuria.  Musculoskeletal:  Negative for joint swelling and myalgias.  Skin:  Negative for color change and rash.  Neurological:  Negative for dizziness and headaches.  Psychiatric/Behavioral:  Negative for agitation and dysphoric mood.        Objective:     BP 122/70   Pulse 83   Temp 98 F (36.7 C)   Resp 16   Ht 5\' 1"  (1.549 m)   Wt 130 lb 6.4 oz (59.1 kg)   SpO2 97%   BMI 24.64 kg/m  Wt Readings from Last 3 Encounters:  02/20/23 130 lb 6.4 oz (59.1 kg)  11/16/22 129 lb 6.4 oz (58.7 kg)  09/07/22 130 lb (59 kg)    Physical Exam Vitals reviewed.  Constitutional:      General: She is not in acute distress.    Appearance: Normal appearance.  HENT:     Head: Normocephalic and atraumatic.      Right Ear: External ear normal.     Left Ear: External ear normal.  Eyes:     General: No scleral icterus.       Right eye: No discharge.        Left eye: No discharge.     Conjunctiva/sclera: Conjunctivae normal.  Neck:     Thyroid: No thyromegaly.  Cardiovascular:     Rate and Rhythm: Normal rate and regular rhythm.  Pulmonary:     Effort: No respiratory distress.     Breath sounds: Normal breath sounds. No wheezing.  Abdominal:     General: Bowel sounds are normal.     Palpations: Abdomen is soft.     Tenderness: There is no abdominal tenderness.  Musculoskeletal:        General: No swelling or tenderness.     Cervical back: Neck supple. No tenderness.  Lymphadenopathy:     Cervical: No cervical adenopathy.  Skin:    Findings: No erythema or rash.  Neurological:     Mental Status: She is alert.  Psychiatric:        Mood and Affect: Mood normal.        Behavior: Behavior normal.      Outpatient Encounter Medications as of 02/20/2023  Medication Sig   Calcium Carbonate-Vitamin D (CALTRATE 600+D PO) Take 1 tablet by mouth 2 (two) times daily.   cholecalciferol (VITAMIN D) 1000 UNITS tablet Take 1,000 Units by mouth daily.   desonide (DESOWEN) 0.05 % ointment at bedtime.   dorzolamide-timolol (COSOPT) 22.3-6.8 MG/ML ophthalmic solution PLACE 1 DROP TWICE DAILY TO LEFT EYE   fexofenadine (ALLEGRA) 180 MG tablet Take 180 mg by mouth daily.   fluticasone (FLONASE) 50 MCG/ACT nasal spray PLACE 2 SPRAYS DAILY INTO BOTH NOSTRILS.   latanoprost (XALATAN) 0.005 % ophthalmic solution INSTILL ONE DROP IN BOTH EYES AT BEDTIME.   meloxicam (MOBIC) 7.5 MG tablet TAKE 1 TABLET BY MOUTH EVERY DAY AS NEEDED   pyridOXINE (VITAMIN B-6) 100 MG tablet Take 100 mg by mouth daily.   rosuvastatin (CRESTOR) 20 MG tablet Take 1 tablet (20 mg total) by mouth daily.   No facility-administered encounter medications on file as of 02/20/2023.     Lab Results  Component Value Date   WBC 4.9  04/17/2022   HGB 14.5 04/17/2022   HCT 43.5 04/17/2022   PLT 214.0 04/17/2022   GLUCOSE 98 02/19/2023   CHOL 184 02/19/2023   TRIG 58.0 02/19/2023   HDL 96.30 02/19/2023   LDLCALC 76 02/19/2023   ALT  24 02/19/2023   AST 26 02/19/2023   NA 140 02/19/2023   K 4.4 02/19/2023   CL 106 02/19/2023   CREATININE 0.69 02/19/2023   BUN 21 02/19/2023   CO2 29 02/19/2023   TSH 1.85 04/17/2022    DG Bone Density  Result Date: 10/26/2022 EXAM: DUAL X-RAY ABSORPTIOMETRY (DXA) FOR BONE MINERAL DENSITY IMPRESSION: Your patient Hassana Worner completed a BMD test on 10/26/2022 using the [employer] DXA System (software version: 14.10) manufactured by [employer]. The following summarizes the results of our evaluation. Technologist:VLM PATIENT BIOGRAPHICAL: Name: Rey, Mccadden Patient ID: [id number] Birth Date: 20-Jan-1946 Height: 61.0 in. Gender: Female Exam Date: 10/26/2022 Weight: 130.0 lbs. Indications: Caucasian, Postmenopausal Fractures: Treatments: calcium w/ vit D DENSITOMETRY RESULTS: Site         Region     Measured Date Measured Age WHO Classification Young Adult T-score BMD         %Change vs. Previous Significant Change (*) AP Spine L1-L2 10/26/2022 78.4 Osteopenia -2.2 0.909 g/cm2 - - Left Forearm Radius 33% 10/26/2022 78.4 Osteopenia -1.6 0.733 g/cm2 - - DualFemur Neck Left 10/26/2022 78.4 Osteopenia -1.5 0.827 g/cm2 - - ASSESSMENT: The BMD measured at AP Spine L1-L2 is 0.909 g/cm2 with a T-score of -2.2. This patient is considered osteopenic according to World Health Organization [facility]) criteria. L-3 & L-4 were excluded due to degenerative changes. The scan quality is good. World Science writer [facility]) criteria for post-menopausal, Caucasian Women: Normal:                   T-score at or above -1 SD Osteopenia/low bone mass: T-score between -1 and -2.5 SD Osteoporosis:             T-score at or below -2.5 SD RECOMMENDATIONS: 1. All patients should optimize calcium and vitamin D intake. 2.  Consider FDA-approved medical therapies in postmenopausal women and men aged 75 years and older, based on the following: a. A hip or vertebral(clinical or morphometric) fracture b. T-score < -2.5 at the femoral neck or spine after appropriate evaluation to exclude secondary causes c. Low bone mass (T-score between -1.0 and -2.5 at the femoral neck or spine) and a 10-year probability of a hip fracture > 3% or a 10-year probability of a major osteoporosis-related fracture > 20% based on the [place]-adapted WHO algorithm 3. Clinician judgment and/or patient preferences may indicate treatment for people with 10-year fracture probabilities above or below these levels FOLLOW-UP: People with diagnosed cases of osteoporosis or at high risk for fracture should have regular bone mineral density tests. For patients eligible for Medicare, routine testing is allowed once every 2 years. The testing frequency can be increased to one year for patients who have rapidly progressing disease, those who are receiving or discontinuing medical therapy to restore bone mass, or have additional risk factors. I have reviewed this report, and agree with the above findings. [facility] Dear Dale Blackburn, Your patient RAEA GRATZER completed a FRAX assessment on 10/26/2022 using the [facility] iDXA DXA System (analysis version: 14.10) manufactured by [employer]. The following summarizes the results of our evaluation. PATIENT BIOGRAPHICAL: Name: Juanna, Feilen Patient ID: [id number] Birth Date: [birth year] Height:    61.0 in. Gender:     Female    [age]       Weight:    130.0 lbs. Ethnicity:  White  Exam Date: 10/26/2022 FRAX* RESULTS:  (version: 3.5) 10-year Probability of Fracture1 Major Osteoporotic Fracture2 Hip Fracture 13.0% 3.0% Population: [place] (Caucasian) Risk Factors: None Based on Femur (Left) Neck BMD 1 -The 10-year probability of fracture may be lower than reported if the patient has received  treatment. 2 -Major Osteoporotic Fracture: Clinical Spine, Forearm, Hip or Shoulder *FRAX is a Armed forces logistics/support/administrative officer of the [employer] of [employer] for Metabolic Bone Disease, a World Science writer (WHO) [employer]. ASSESSMENT: The probability of a major osteoporotic fracture is 13.0% within the next ten years. The probability of a hip fracture is 3.0% within the next ten years. . Electronically Signed   By: Bary Richard M.D.   On: 10/26/2022 10:08       Assessment & Plan:  Hypercholesterolemia Assessment & Plan: On crestor.  Continue diet and exercise.  Follow lipid panel and liver function tests.    Orders: -     CBC with Differential/Platelet; Future -     Lipid panel; Future -     Hepatic function panel; Future -     Basic metabolic panel; Future  Colon cancer screening Assessment & Plan: Colonoscopy 02/2017.  Recommended f/u colonoscopy in 10 years.     Nontoxic uninodular goiter Assessment & Plan: Follow tsh. Previously discussed f/u thyroid ultrasound.  Had wanted to hold.  Follow.   Orders: -     TSH; Future  Osteopenia, unspecified location Assessment & Plan: Continue calcium, vitamin D and weight bearing exercise.     Vitamin D deficiency Assessment & Plan: Continue vitamin D supplements.   Orders: -     VITAMIN D 25 Hydroxy (Vit-D Deficiency, Fractures); Future     Dale [place], MD

## 2023-02-20 ENCOUNTER — Encounter: Payer: Self-pay | Admitting: Internal Medicine

## 2023-02-20 ENCOUNTER — Ambulatory Visit: Payer: Federal, State, Local not specified - PPO | Admitting: Internal Medicine

## 2023-02-20 VITALS — BP 122/70 | HR 83 | Temp 98.0°F | Resp 16 | Ht 61.0 in | Wt 130.4 lb

## 2023-02-20 DIAGNOSIS — E559 Vitamin D deficiency, unspecified: Secondary | ICD-10-CM

## 2023-02-20 DIAGNOSIS — Z1211 Encounter for screening for malignant neoplasm of colon: Secondary | ICD-10-CM

## 2023-02-20 DIAGNOSIS — M8580 Other specified disorders of bone density and structure, unspecified site: Secondary | ICD-10-CM | POA: Diagnosis not present

## 2023-02-20 DIAGNOSIS — E7800 Pure hypercholesterolemia, unspecified: Secondary | ICD-10-CM

## 2023-02-20 DIAGNOSIS — E041 Nontoxic single thyroid nodule: Secondary | ICD-10-CM | POA: Diagnosis not present

## 2023-02-25 ENCOUNTER — Encounter: Payer: Self-pay | Admitting: Internal Medicine

## 2023-02-25 NOTE — Assessment & Plan Note (Signed)
Follow tsh. Previously discussed f/u thyroid ultrasound.  Had wanted to hold.  Follow.

## 2023-02-25 NOTE — Assessment & Plan Note (Signed)
Colonoscopy 02/2017.  Recommended f/u colonoscopy in 10 years.   

## 2023-02-25 NOTE — Assessment & Plan Note (Signed)
Continue calcium, vitamin D and weight bearing exercise.   

## 2023-02-25 NOTE — Assessment & Plan Note (Signed)
Continue vitamin D supplements.  

## 2023-02-25 NOTE — Assessment & Plan Note (Signed)
On crestor.  Continue diet and exercise.  Follow lipid panel and liver function tests.

## 2023-05-21 ENCOUNTER — Other Ambulatory Visit: Payer: Self-pay | Admitting: Internal Medicine

## 2023-06-08 ENCOUNTER — Other Ambulatory Visit: Payer: Self-pay | Admitting: Internal Medicine

## 2023-06-08 DIAGNOSIS — Z1231 Encounter for screening mammogram for malignant neoplasm of breast: Secondary | ICD-10-CM

## 2023-06-20 ENCOUNTER — Other Ambulatory Visit: Payer: Federal, State, Local not specified - PPO

## 2023-06-20 ENCOUNTER — Ambulatory Visit
Admission: RE | Admit: 2023-06-20 | Discharge: 2023-06-20 | Disposition: A | Discharge status: Home / Self Care | Payer: Federal, State, Local not specified - PPO | Source: Ambulatory Visit | Attending: Internal Medicine | Admitting: Internal Medicine

## 2023-06-20 DIAGNOSIS — E7800 Pure hypercholesterolemia, unspecified: Secondary | ICD-10-CM

## 2023-06-20 DIAGNOSIS — E559 Vitamin D deficiency, unspecified: Secondary | ICD-10-CM

## 2023-06-20 DIAGNOSIS — Z1231 Encounter for screening mammogram for malignant neoplasm of breast: Secondary | ICD-10-CM | POA: Diagnosis present

## 2023-06-20 DIAGNOSIS — E041 Nontoxic single thyroid nodule: Secondary | ICD-10-CM | POA: Diagnosis not present

## 2023-06-20 LAB — HEPATIC FUNCTION PANEL
ALT: 21 U/L (ref 0–35)
AST: 24 U/L (ref 0–37)
Albumin: 4 g/dL (ref 3.5–5.2)
Alkaline Phosphatase: 56 U/L (ref 39–117)
Bilirubin, Direct: 0.2 mg/dL (ref 0.0–0.3)
Total Bilirubin: 0.7 mg/dL (ref 0.2–1.2)
Total Protein: 6.2 g/dL (ref 6.0–8.3)

## 2023-06-20 LAB — CBC WITH DIFFERENTIAL/PLATELET
Basophils Absolute: 0 10*3/uL (ref 0.0–0.1)
Basophils Relative: 0.9 % (ref 0.0–3.0)
Eosinophils Absolute: 0.2 10*3/uL (ref 0.0–0.7)
Eosinophils Relative: 4.3 % (ref 0.0–5.0)
HCT: 44.1 % (ref 36.0–46.0)
Hemoglobin: 14.4 g/dL (ref 12.0–15.0)
Lymphocytes Relative: 33.3 % (ref 12.0–46.0)
Lymphs Abs: 1.6 10*3/uL (ref 0.7–4.0)
MCHC: 32.6 g/dL (ref 30.0–36.0)
MCV: 92.1 fL (ref 78.0–100.0)
Monocytes Absolute: 0.5 10*3/uL (ref 0.1–1.0)
Monocytes Relative: 10.3 % (ref 3.0–12.0)
Neutro Abs: 2.4 10*3/uL (ref 1.4–7.7)
Neutrophils Relative %: 51.2 % (ref 43.0–77.0)
Platelets: 249 10*3/uL (ref 150.0–400.0)
RBC: 4.79 Mil/uL (ref 3.87–5.11)
RDW: 12.7 % (ref 11.5–15.5)
WBC: 4.7 10*3/uL (ref 4.0–10.5)

## 2023-06-20 LAB — LIPID PANEL
Cholesterol: 174 mg/dL (ref 0–200)
HDL: 96.1 mg/dL (ref >39.00)
LDL Cholesterol: 69 mg/dL (ref 0–99)
NonHDL: 77.98
Total CHOL/HDL Ratio: 2
Triglycerides: 44 mg/dL (ref 0.0–149.0)
VLDL: 8.8 mg/dL (ref 0.0–40.0)

## 2023-06-20 LAB — BASIC METABOLIC PANEL
BUN: 23 mg/dL (ref 6–23)
CO2: 28 meq/L (ref 19–32)
Calcium: 9 mg/dL (ref 8.4–10.5)
Chloride: 105 meq/L (ref 96–112)
Creatinine, Ser: 0.65 mg/dL (ref 0.40–1.20)
GFR: 83.92 mL/min (ref >60.00)
Glucose, Bld: 91 mg/dL (ref 70–99)
Potassium: 4.3 meq/L (ref 3.5–5.1)
Sodium: 139 meq/L (ref 135–145)

## 2023-06-20 LAB — TSH: TSH: 1.56 u[IU]/mL (ref 0.35–5.50)

## 2023-06-20 LAB — VITAMIN D 25 HYDROXY (VIT D DEFICIENCY, FRACTURES): VITD: 47.05 ng/mL (ref 30.00–100.00)

## 2023-06-26 ENCOUNTER — Ambulatory Visit: Payer: Federal, State, Local not specified - PPO | Admitting: Internal Medicine

## 2023-06-26 VITALS — BP 126/72 | HR 83 | Temp 98.2°F | Resp 16 | Ht 61.0 in | Wt 131.6 lb

## 2023-06-26 DIAGNOSIS — E041 Nontoxic single thyroid nodule: Secondary | ICD-10-CM

## 2023-06-26 DIAGNOSIS — R030 Elevated blood-pressure reading, without diagnosis of hypertension: Secondary | ICD-10-CM | POA: Diagnosis not present

## 2023-06-26 DIAGNOSIS — Z1211 Encounter for screening for malignant neoplasm of colon: Secondary | ICD-10-CM

## 2023-06-26 DIAGNOSIS — E7800 Pure hypercholesterolemia, unspecified: Secondary | ICD-10-CM | POA: Diagnosis not present

## 2023-06-26 NOTE — Progress Notes (Signed)
Subjective:    Patient ID: Vickie Crawford, female    DOB: 09/01/1945, 77 y.o.   MRN: [id number]  Patient here for  Chief Complaint  Patient presents with   Medical Management of Chronic Issues    HPI Here for f/u - f/u regarding blood pressure and cholesterol.  She is staying active.  Traveling.  No chest pain or sob reported.  No abdominal pain or bowel change reported.  Has glaucoma.  Followed by ophthalmology.  Reviewed outside blood pressures. Most averaging 110-121/70s.  Overall doing well. Seeing ophthalmology - f/u glaucoma.    Past Medical History:  Diagnosis Date   Allergy    Arthritis    fingers, toes   Glaucoma    Hypercholesterolemia    Motion sickness    Rough ocean waters   Osteopenia    Ovarian cyst    Hospitalized 1969 and 1974 for removal   Thyroid nodule    Vitamin D deficiency    Past Surgical History:  Procedure Laterality Date   CATARACT EXTRACTION W/PHACO Left 06/28/2020   Procedure: CATARACT EXTRACTION PHACO AND INTRAOCULAR LENS PLACEMENT (IOC) LEFT ISTENT INJ;  Surgeon: Nevada Crane, MD;  Location: [facility];  Service: Ophthalmology;  Laterality: Left;  7.96 0:52.3   CATARACT EXTRACTION W/PHACO Right 08/02/2020   Procedure: CATARACT EXTRACTION PHACO AND INTRAOCULAR LENS PLACEMENT (IOC) RIGHT ISTENT INJ;  Surgeon: Nevada Crane, MD;  Location: [facility];  Service: Ophthalmology;  Laterality: Right;  1.84 0:20.0   COLONOSCOPY W/ POLYPECTOMY     COLONOSCOPY WITH PROPOFOL N/A 03/07/2017   Procedure: COLONOSCOPY WITH PROPOFOL;  Surgeon: Scot Jun, MD;  Location: [facility] ENDOSCOPY;  Service: Endoscopy;  Laterality: N/A;   OVARIAN CYST REMOVAL     1969 and 1974   Family History  Problem Relation Age of Onset   Hypertension Mother    Heart disease Mother    Stroke Father    Brain cancer Sister    Breast cancer Neg Hx    Social History   Socioeconomic History   Marital status: Married    Spouse name: Not on file    Number of children: 2   Years of education: Not on file   Highest education level: Not on file  Occupational History   Not on file  Tobacco Use   Smoking status: Former    Current packs/day: 0.00    Types: Cigarettes    Quit date: 1995    Years since quitting: 29.8   Smokeless tobacco: Never  Vaping Use   Vaping status: Never Used  Substance and Sexual Activity   Alcohol use: Yes    Alcohol/week: 2.0 standard drinks of alcohol    Types: 2 Glasses of wine per week   Drug use: No   Sexual activity: Not on file  Other Topics Concern   Not on file  Social History Narrative   Not on file   Social Determinants of Health   Financial Resource Strain: Not on file  Food Insecurity: Not on file  Transportation Needs: Not on file  Physical Activity: Not on file  Stress: Not on file  Social Connections: Not on file     Review of Systems  Constitutional:  Negative for appetite change and unexpected weight change.  HENT:  Negative for congestion and sinus pressure.   Respiratory:  Negative for cough, chest tightness and shortness of breath.   Cardiovascular:  Negative for chest pain and palpitations.  Gastrointestinal:  Negative for  abdominal pain, diarrhea, nausea and vomiting.  Genitourinary:  Negative for difficulty urinating and dysuria.  Musculoskeletal:  Negative for joint swelling and myalgias.  Skin:  Negative for color change and rash.  Neurological:  Negative for dizziness and headaches.  Psychiatric/Behavioral:  Negative for agitation and dysphoric mood.        Objective:     BP 126/72   Pulse 83   Temp 98.2 F (36.8 C)   Resp 16   Ht 5\' 1"  (1.549 m)   Wt 131 lb 9.6 oz (59.7 kg)   SpO2 98%   BMI 24.87 kg/m  Wt Readings from Last 3 Encounters:  06/26/23 131 lb 9.6 oz (59.7 kg)  02/20/23 130 lb 6.4 oz (59.1 kg)  11/16/22 129 lb 6.4 oz (58.7 kg)    Physical Exam Vitals reviewed.  Constitutional:      General: She is not in acute distress.     Appearance: Normal appearance.  HENT:     Head: Normocephalic and atraumatic.     Right Ear: External ear normal.     Left Ear: External ear normal.  Eyes:     General: No scleral icterus.       Right eye: No discharge.        Left eye: No discharge.     Conjunctiva/sclera: Conjunctivae normal.  Neck:     Thyroid: No thyromegaly.  Cardiovascular:     Rate and Rhythm: Normal rate and regular rhythm.  Pulmonary:     Effort: No respiratory distress.     Breath sounds: Normal breath sounds. No wheezing.  Abdominal:     General: Bowel sounds are normal.     Palpations: Abdomen is soft.     Tenderness: There is no abdominal tenderness.  Musculoskeletal:        General: No swelling or tenderness.     Cervical back: Neck supple. No tenderness.  Lymphadenopathy:     Cervical: No cervical adenopathy.  Skin:    Findings: No erythema or rash.  Neurological:     Mental Status: She is alert.  Psychiatric:        Mood and Affect: Mood normal.        Behavior: Behavior normal.      Outpatient Encounter Medications as of 06/26/2023  Medication Sig   Calcium Carbonate-Vitamin D (CALTRATE 600+D PO) Take 1 tablet by mouth 2 (two) times daily.   cholecalciferol (VITAMIN D) 1000 UNITS tablet Take 1,000 Units by mouth daily.   desonide (DESOWEN) 0.05 % ointment at bedtime.   dorzolamide-timolol (COSOPT) 22.3-6.8 MG/ML ophthalmic solution PLACE 1 DROP TWICE DAILY TO LEFT EYE   fexofenadine (ALLEGRA) 180 MG tablet Take 180 mg by mouth daily.   fluticasone (FLONASE) 50 MCG/ACT nasal spray PLACE 2 SPRAYS DAILY INTO BOTH NOSTRILS.   latanoprost (XALATAN) 0.005 % ophthalmic solution INSTILL ONE DROP IN BOTH EYES AT BEDTIME.   pyridOXINE (VITAMIN B-6) 100 MG tablet Take 100 mg by mouth daily.   rosuvastatin (CRESTOR) 20 MG tablet Take 1 tablet (20 mg total) by mouth daily.   [DISCONTINUED] meloxicam (MOBIC) 7.5 MG tablet TAKE 1 TABLET BY MOUTH EVERY DAY AS NEEDED   No facility-administered  encounter medications on file as of 06/26/2023.     Lab Results  Component Value Date   WBC 4.7 06/20/2023   HGB 14.4 06/20/2023   HCT 44.1 06/20/2023   PLT 249.0 06/20/2023   GLUCOSE 91 06/20/2023   CHOL 174 06/20/2023   TRIG 44.0 06/20/2023  HDL 96.10 06/20/2023   LDLCALC 69 06/20/2023   ALT 21 06/20/2023   AST 24 06/20/2023   NA 139 06/20/2023   K 4.3 06/20/2023   CL 105 06/20/2023   CREATININE 0.65 06/20/2023   BUN 23 06/20/2023   CO2 28 06/20/2023   TSH 1.56 06/20/2023    MM 3D SCREENING MAMMOGRAM BILATERAL BREAST  Result Date: 06/22/2023 CLINICAL DATA:  Screening. EXAM: DIGITAL SCREENING BILATERAL MAMMOGRAM WITH TOMOSYNTHESIS AND CAD TECHNIQUE: Bilateral screening digital craniocaudal and mediolateral oblique mammograms were obtained. Bilateral screening digital breast tomosynthesis was performed. The images were evaluated with computer-aided detection. COMPARISON:  Previous exam(s). ACR Breast Density Category a: The breasts are almost entirely fatty. FINDINGS: There are no findings suspicious for malignancy. IMPRESSION: No mammographic evidence of malignancy. A result letter of this screening mammogram will be mailed directly to the patient. RECOMMENDATION: Screening mammogram in one year. (Code:SM-B-01Y) BI-RADS CATEGORY  1: Negative. Electronically Signed   By: Frederico Hamman M.D.   On: 06/22/2023 07:25       Assessment & Plan:  Pure hypercholesterolemia Assessment & Plan: On crestor.  Low cholesterol diet and exercise.  Follow lipid panel and liver function tests.   Lab Results  Component Value Date   CHOL 174 06/20/2023   HDL 96.10 06/20/2023   LDLCALC 69 06/20/2023   TRIG 44.0 06/20/2023   CHOLHDL 2 06/20/2023      Nontoxic uninodular goiter Assessment & Plan: Follow tsh. Previously discussed f/u thyroid ultrasound.  Had wanted to hold.  Follow.    Elevated blood pressure reading Assessment & Plan: Blood pressures doing well as outlined.  Continue  to monitor. On no medication. Follow.    Colon cancer screening Assessment & Plan: Colonoscopy 02/2017.  Recommended f/u colonoscopy in 10 years.        Dale Wallace, MD

## 2023-06-29 ENCOUNTER — Other Ambulatory Visit: Payer: Self-pay | Admitting: Internal Medicine

## 2023-06-29 NOTE — Telephone Encounter (Signed)
Rx ok'd for meloxicam #90 with no refills

## 2023-07-01 ENCOUNTER — Encounter: Payer: Self-pay | Admitting: Internal Medicine

## 2023-07-01 NOTE — Assessment & Plan Note (Signed)
Blood pressures doing well as outlined.  Continue to monitor. On no medication. Follow.

## 2023-07-01 NOTE — Assessment & Plan Note (Signed)
Follow tsh. Previously discussed f/u thyroid ultrasound.  Had wanted to hold.  Follow.

## 2023-07-01 NOTE — Assessment & Plan Note (Signed)
Colonoscopy 02/2017.  Recommended f/u colonoscopy in 10 years.   

## 2023-07-01 NOTE — Assessment & Plan Note (Addendum)
On crestor.  Low cholesterol diet and exercise.  Follow lipid panel and liver function tests.   Lab Results  Component Value Date   CHOL 174 06/20/2023   HDL 96.10 06/20/2023   LDLCALC 69 06/20/2023   TRIG 44.0 06/20/2023   CHOLHDL 2 06/20/2023

## 2023-07-04 ENCOUNTER — Ambulatory Visit
Admission: RE | Admit: 2023-07-04 | Discharge: 2023-07-04 | Disposition: A | Discharge status: Home / Self Care | Payer: Federal, State, Local not specified - PPO | Source: Ambulatory Visit

## 2023-07-04 VITALS — BP 127/77 | HR 75 | Temp 98.3°F | Resp 19

## 2023-07-04 DIAGNOSIS — J069 Acute upper respiratory infection, unspecified: Secondary | ICD-10-CM | POA: Diagnosis not present

## 2023-07-04 MED ORDER — PREDNISONE 10 MG (21) PO TBPK: ORAL_TABLET | Freq: Every day | ORAL | 0 refills | Status: DC

## 2023-07-04 NOTE — Discharge Instructions (Addendum)
Take the prednisone as directed.  Follow up with your primary care provider if your symptoms are not improving.    

## 2023-07-04 NOTE — ED Provider Notes (Signed)
Vickie Crawford    CSN: [id number] Arrival date & time: 07/04/23  1115      History   Chief Complaint Chief Complaint  Patient presents with   Cough    Entered by patient    HPI Vickie Crawford is a 77 y.o. female.  Patient presents with 4-day history of congestion, postnasal drip, cough.  No fever, shortness of breath, or other symptoms.  Treating with Robitussin and NyQuil.  Her medical history includes thyroid disease, glaucoma, arthritis, osteopenia.  The history is provided by the patient and medical records.    Past Medical History:  Diagnosis Date   Allergy    Arthritis    fingers, toes   Glaucoma    Hypercholesterolemia    Motion sickness    Rough ocean waters   Osteopenia    Ovarian cyst    Hospitalized 1969 and 1974 for removal   Thyroid nodule    Vitamin D deficiency     Patient Active Problem List   Diagnosis Date Noted   Hypercholesterolemia 11/26/2022   GERD (gastroesophageal reflux disease) 09/10/2022   Cough 04/22/2022   Nasal congestion 12/30/2020   Glaucoma 10/29/2018   Elevated blood pressure reading 12/30/2017   Colon cancer screening 07/04/2015   Health care maintenance 12/29/2014   Skin lesion 12/12/2013   Vitamin D deficiency 12/22/2012   Osteopenia 12/22/2012   URI (upper respiratory infection) 12/06/2012   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   Pure hypercholesterolemia 10/08/2012    Past Surgical History:  Procedure Laterality Date   CATARACT EXTRACTION W/PHACO Left 06/28/2020   Procedure: CATARACT EXTRACTION PHACO AND INTRAOCULAR LENS PLACEMENT (IOC) LEFT ISTENT INJ;  Surgeon: Nevada Crane, MD;  Location: [facility];  Service: Ophthalmology;  Laterality: Left;  7.96 0:52.3   CATARACT EXTRACTION W/PHACO Right 08/02/2020   Procedure: CATARACT EXTRACTION PHACO AND INTRAOCULAR LENS PLACEMENT (IOC) RIGHT ISTENT INJ;  Surgeon: Nevada Crane, MD;  Location: [facility];  Service: Ophthalmology;   Laterality: Right;  1.84 0:20.0   COLONOSCOPY W/ POLYPECTOMY     COLONOSCOPY WITH PROPOFOL N/A 03/07/2017   Procedure: COLONOSCOPY WITH PROPOFOL;  Surgeon: Scot Jun, MD;  Location: [facility] ENDOSCOPY;  Service: Endoscopy;  Laterality: N/A;   OVARIAN CYST REMOVAL     1969 and 1974    OB History   No obstetric history on file.      Home Medications    Prior to Admission medications   Medication Sig Start Date End Date Taking? Authorizing Provider  predniSONE (STERAPRED UNI-PAK 21 TAB) 10 MG (21) TBPK tablet Take by mouth daily. As directed 07/04/23  Yes Mickie Bail, NP  RHOPRESSA 0.02 % SOLN Place 1 drop into the left eye at bedtime. 07/01/23  Yes [provider]  Calcium Carbonate-Vitamin D (CALTRATE 600+D PO) Take 1 tablet by mouth 2 (two) times daily.    [provider]  cholecalciferol (VITAMIN D) 1000 UNITS tablet Take 1,000 Units by mouth daily.    [provider]  desonide (DESOWEN) 0.05 % ointment at bedtime. 10/29/14   [provider]  dorzolamide-timolol (COSOPT) 22.3-6.8 MG/ML ophthalmic solution PLACE 1 DROP TWICE DAILY TO LEFT EYE 02/24/22   Dale Haleburg, MD  fexofenadine (ALLEGRA) 180 MG tablet Take 180 mg by mouth daily.    [provider]  fluticasone (FLONASE) 50 MCG/ACT nasal spray PLACE 2 SPRAYS DAILY INTO BOTH NOSTRILS. 05/21/23   Dale Hyden, MD  latanoprost (XALATAN) 0.005 % ophthalmic solution INSTILL ONE DROP  IN BOTH EYES AT BEDTIME. 06/18/15   [provider]  meloxicam (MOBIC) 7.5 MG tablet TAKE 1 TABLET BY MOUTH EVERY DAY AS NEEDED 06/29/23   Dale Chupadero, MD  pyridOXINE (VITAMIN B-6) 100 MG tablet Take 100 mg by mouth daily.    [provider]  rosuvastatin (CRESTOR) 20 MG tablet Take 1 tablet (20 mg total) by mouth daily. 09/07/22   Dale , MD    Family History Family History  Problem Relation Age of Onset   Hypertension Mother    Heart disease Mother    Stroke Father     Brain cancer Sister    Breast cancer Neg Hx     Social History Social History   Tobacco Use   Smoking status: Former    Current packs/day: 0.00    Types: Cigarettes    Quit date: 1995    Years since quitting: 29.8   Smokeless tobacco: Never  Vaping Use   Vaping status: Never Used  Substance Use Topics   Alcohol use: Yes    Alcohol/week: 2.0 standard drinks of alcohol    Types: 2 Glasses of wine per week   Drug use: No     Allergies   Patient has no known allergies.   Review of Systems Review of Systems  Constitutional:  Negative for chills and fever.  HENT:  Positive for congestion and postnasal drip. Negative for ear pain and sore throat.   Respiratory:  Positive for cough. Negative for shortness of breath.   Cardiovascular:  Negative for chest pain and palpitations.  Gastrointestinal:  Negative for diarrhea and vomiting.     Physical Exam Triage Vital Signs ED Triage Vitals  Encounter Vitals Group     BP      Systolic BP Percentile      Diastolic BP Percentile      Pulse      Resp      Temp      Temp src      SpO2      Weight      Height      Head Circumference      Peak Flow      Pain Score      Pain Loc      Pain Education      Exclude from Growth Chart    No data found.  Updated Vital Signs BP 127/77   Pulse 75   Temp 98.3 F (36.8 C)   Resp 19   SpO2 97%   Visual Acuity Right Eye Distance:   Left Eye Distance:   Bilateral Distance:    Right Eye Near:   Left Eye Near:    Bilateral Near:     Physical Exam Constitutional:      General: She is not in acute distress. HENT:     Right Ear: Tympanic membrane normal.     Left Ear: Tympanic membrane normal.     Nose: Nose normal.     Mouth/Throat:     Mouth: Mucous membranes are moist.     Pharynx: Oropharynx is clear.  Cardiovascular:     Rate and Rhythm: Normal rate and regular rhythm.     Heart sounds: Normal heart sounds.  Pulmonary:     Effort: Pulmonary effort is normal. No  respiratory distress.     Breath sounds: Normal breath sounds.  Skin:    General: Skin is warm and dry.  Neurological:     Mental Status: She is alert.  UC Treatments / Results  Labs (all labs ordered are listed, but only abnormal results are displayed) Labs Reviewed - No data to display  EKG   Radiology No results found.  Procedures Procedures (including critical care time)  Medications Ordered in UC Medications - No data to display  Initial Impression / Assessment and Plan / UC Course  I have reviewed the triage vital signs and the nursing notes.  Pertinent labs & imaging results that were available during my care of the patient were reviewed by me and considered in my medical decision making (see chart for details).    Viral URI.  Afebrile and vital signs are stable.  Lungs are clear and O2 sat is 97% on room air.  Patient has been symptomatic for 4 days.  She declines Lawyer, stating these do not work well for her.  Treating with prednisone taper.  Instructed her to follow-up with her PCP if her symptoms are not improving.  Education provided on viral respiratory infection.  She agrees to plan of care.  Final Clinical Impressions(s) / UC Diagnoses   Final diagnoses:  Viral URI     Discharge Instructions      Take the prednisone as directed.  Follow-up with your primary care provider if your symptoms are not improving.      ED Prescriptions     Medication Sig Dispense Auth. Provider   predniSONE (STERAPRED UNI-PAK 21 TAB) 10 MG (21) TBPK tablet Take by mouth daily. As directed 21 tablet Mickie Bail, NP      PDMP not reviewed this encounter.   Mickie Bail, NP 07/04/23 1143

## 2023-07-04 NOTE — ED Triage Notes (Signed)
Patient to Urgent Care with complaints of cough and post nasal drip. Symptoms worse when laying down. Reports her head feels full.   Symptoms started approx four days ago. Spent time outside over the weekend.   Has been taking robitussin q4/ Nyquil.

## 2023-09-19 ENCOUNTER — Other Ambulatory Visit: Payer: Self-pay | Admitting: Internal Medicine

## 2023-09-24 NOTE — Telephone Encounter (Signed)
Her eye drops need to be refilled by ophthalmology. Please call and notify pt. If on this medication, she should be being followed by ophthalmology. See me about this if questions.

## 2023-09-25 ENCOUNTER — Other Ambulatory Visit: Payer: Self-pay | Admitting: Internal Medicine

## 2023-09-27 NOTE — Telephone Encounter (Signed)
Please refuse. Sent to you in error by [facility]. Pt has her eye drops and is followed by ophthalmology.

## 2023-10-07 ENCOUNTER — Other Ambulatory Visit: Payer: Self-pay | Admitting: Internal Medicine

## 2023-11-04 ENCOUNTER — Other Ambulatory Visit: Payer: Self-pay | Admitting: Internal Medicine

## 2023-12-19 ENCOUNTER — Other Ambulatory Visit: Payer: Self-pay

## 2023-12-19 DIAGNOSIS — E7800 Pure hypercholesterolemia, unspecified: Secondary | ICD-10-CM

## 2023-12-20 ENCOUNTER — Other Ambulatory Visit (INDEPENDENT_AMBULATORY_CARE_PROVIDER_SITE_OTHER): Payer: Federal, State, Local not specified - PPO

## 2023-12-20 DIAGNOSIS — E7800 Pure hypercholesterolemia, unspecified: Secondary | ICD-10-CM

## 2023-12-20 LAB — LIPID PANEL
Cholesterol: 160 mg/dL (ref 0–200)
HDL: 97.1 mg/dL (ref >39.00)
LDL Cholesterol: 54 mg/dL (ref 0–99)
NonHDL: 63.22
Total CHOL/HDL Ratio: 2
Triglycerides: 48 mg/dL (ref 0.0–149.0)
VLDL: 9.6 mg/dL (ref 0.0–40.0)

## 2023-12-20 LAB — HEPATIC FUNCTION PANEL
ALT: 23 U/L (ref 0–35)
AST: 27 U/L (ref 0–37)
Albumin: 4.2 g/dL (ref 3.5–5.2)
Alkaline Phosphatase: 50 U/L (ref 39–117)
Bilirubin, Direct: 0.2 mg/dL (ref 0.0–0.3)
Total Bilirubin: 0.6 mg/dL (ref 0.2–1.2)
Total Protein: 6.6 g/dL (ref 6.0–8.3)

## 2023-12-20 LAB — BASIC METABOLIC PANEL WITH GFR
BUN: 23 mg/dL (ref 6–23)
CO2: 29 meq/L (ref 19–32)
Calcium: 8.9 mg/dL (ref 8.4–10.5)
Chloride: 104 meq/L (ref 96–112)
Creatinine, Ser: 0.72 mg/dL (ref 0.40–1.20)
GFR: 79.42 mL/min (ref >60.00)
Glucose, Bld: 96 mg/dL (ref 70–99)
Potassium: 4.3 meq/L (ref 3.5–5.1)
Sodium: 139 meq/L (ref 135–145)

## 2023-12-21 ENCOUNTER — Other Ambulatory Visit: Payer: Self-pay | Admitting: Internal Medicine

## 2023-12-24 NOTE — Telephone Encounter (Signed)
 Patient is scheduled for an appointment tomorrow with provider (Dr Geralyn Knee).

## 2023-12-25 ENCOUNTER — Encounter: Payer: Self-pay | Admitting: Internal Medicine

## 2023-12-25 ENCOUNTER — Ambulatory Visit (INDEPENDENT_AMBULATORY_CARE_PROVIDER_SITE_OTHER): Payer: Federal, State, Local not specified - PPO | Admitting: Internal Medicine

## 2023-12-25 VITALS — BP 118/76 | HR 90 | Temp 98.0°F | Resp 16 | Ht 61.0 in | Wt 128.0 lb

## 2023-12-25 DIAGNOSIS — E041 Nontoxic single thyroid nodule: Secondary | ICD-10-CM

## 2023-12-25 DIAGNOSIS — H409 Unspecified glaucoma: Secondary | ICD-10-CM

## 2023-12-25 DIAGNOSIS — E7800 Pure hypercholesterolemia, unspecified: Secondary | ICD-10-CM

## 2023-12-25 DIAGNOSIS — M8580 Other specified disorders of bone density and structure, unspecified site: Secondary | ICD-10-CM

## 2023-12-25 DIAGNOSIS — Z0000 Encounter for general adult medical examination without abnormal findings: Secondary | ICD-10-CM

## 2023-12-25 MED ORDER — MELOXICAM 7.5 MG PO TABS: 7.5000 mg | ORAL_TABLET | Freq: Every day | ORAL | 0 refills | Status: DC | PRN

## 2023-12-25 NOTE — Assessment & Plan Note (Signed)
 Last bone density - osteopenia. Continue calcium , vitamin D  and weight bearing exercise. Follow.

## 2023-12-25 NOTE — Assessment & Plan Note (Signed)
Follow tsh. Previously discussed f/u thyroid ultrasound.  Had wanted to hold.  Follow.

## 2023-12-25 NOTE — Assessment & Plan Note (Addendum)
 Physical today 12/25/23.  Colonoscopy 02/2017- recommended f/u in 10 years.  Mammogram 06/20/23 - Birads I.  Bone density - osteopenia

## 2023-12-25 NOTE — Assessment & Plan Note (Signed)
 Continues on crestor . Continue diet and exercise.  Discussed recent labs.   Lab Results  Component Value Date   CHOL 160 12/20/2023   HDL 97.10 12/20/2023   LDLCALC 54 12/20/2023   TRIG 48.0 12/20/2023   CHOLHDL 2 12/20/2023

## 2023-12-25 NOTE — Assessment & Plan Note (Signed)
Followed closely by ophthalmology ?

## 2023-12-25 NOTE — Progress Notes (Signed)
 Subjective:    Patient ID: Vickie Crawford, female    DOB: 08/10/1946, 78 y.o.   MRN: [id number]  Patient here for  Chief Complaint  Patient presents with   Medical Management of Chronic Issues    HPI Here for a physical exam. She is doing well. Stays active. No chest pain or sob. No cough or congestion. No abdominal pain or bowel change. Continues on 7.5mg  meloxicam . Controls her joint issues. Able to stay active. Last bone density - osteopenia. Continue weight bearing exercise. Reviewed outside blood pressures - averaging 120-130/70-80.    Past Medical History:  Diagnosis Date   Allergy    Arthritis    fingers, toes   Glaucoma    Hypercholesterolemia    Motion sickness    Rough ocean waters   Osteopenia    Ovarian cyst    Hospitalized 1969 and 1974 for removal   Thyroid  nodule    Vitamin D  deficiency    Past Surgical History:  Procedure Laterality Date   CATARACT EXTRACTION W/PHACO Left 06/28/2020   Procedure: CATARACT EXTRACTION PHACO AND INTRAOCULAR LENS PLACEMENT (IOC) LEFT ISTENT INJ;  Surgeon: Rosa College, MD;  Location: [facility];  Service: Ophthalmology;  Laterality: Left;  7.96 0:52.3   CATARACT EXTRACTION W/PHACO Right 08/02/2020   Procedure: CATARACT EXTRACTION PHACO AND INTRAOCULAR LENS PLACEMENT (IOC) RIGHT ISTENT INJ;  Surgeon: Rosa College, MD;  Location: [facility];  Service: Ophthalmology;  Laterality: Right;  1.84 0:20.0   COLONOSCOPY W/ POLYPECTOMY     COLONOSCOPY WITH PROPOFOL  N/A 03/07/2017   Procedure: COLONOSCOPY WITH PROPOFOL ;  Surgeon: Cassie Click, MD;  Location: [facility];  Service: Endoscopy;  Laterality: N/A;   OVARIAN CYST REMOVAL     1969 and 1974   Family History  Problem Relation Age of Onset   Hypertension Mother    Heart disease Mother    Stroke Father    Brain cancer Sister    Breast cancer Neg Hx    Social History   Socioeconomic History   Marital status: Married    Spouse name: Not on  file   Number of children: 2   Years of education: Not on file   Highest education level: Not on file  Occupational History   Not on file  Tobacco Use   Smoking status: Former    Current packs/day: 0.00    Types: Cigarettes    Quit date: 1995    Years since quitting: 30.3   Smokeless tobacco: Never  Vaping Use   Vaping status: Never Used  Substance and Sexual Activity   Alcohol use: Yes    Alcohol/week: 2.0 standard drinks of alcohol    Types: 2 Glasses of wine per week   Drug use: No   Sexual activity: Not on file  Other Topics Concern   Not on file  Social History Narrative   Not on file   Social Drivers of Health   Financial Resource Strain: Not on file  Food Insecurity: Not on file  Transportation Needs: Not on file  Physical Activity: Not on file  Stress: Not on file  Social Connections: Not on file     Review of Systems  Constitutional:  Negative for appetite change and unexpected weight change.  HENT:  Negative for congestion, sinus pressure and sore throat.   Eyes:  Negative for pain and visual disturbance.  Respiratory:  Negative for cough, chest tightness and shortness of breath.   Cardiovascular:  Negative for  chest pain, palpitations and leg swelling.  Gastrointestinal:  Negative for abdominal pain, diarrhea, nausea and vomiting.  Genitourinary:  Negative for difficulty urinating and dysuria.  Musculoskeletal:  Negative for joint swelling and myalgias.  Skin:  Negative for color change and rash.  Neurological:  Negative for dizziness and headaches.  Hematological:  Negative for adenopathy. Does not bruise/bleed easily.  Psychiatric/Behavioral:  Negative for agitation and dysphoric mood.        Objective:     BP 118/76   Pulse 90   Temp 98 F (36.7 C)   Resp 16   Ht 5\' 1"  (1.549 m)   Wt 128 lb (58.1 kg)   SpO2 98%   BMI 24.19 kg/m  Wt Readings from Last 3 Encounters:  12/25/23 128 lb (58.1 kg)  06/26/23 131 lb 9.6 oz (59.7 kg)  02/20/23  130 lb 6.4 oz (59.1 kg)    Physical Exam Vitals reviewed.  Constitutional:      General: She is not in acute distress.    Appearance: Normal appearance. She is well-developed.  HENT:     Head: Normocephalic and atraumatic.     Right Ear: External ear normal.     Left Ear: External ear normal.     Mouth/Throat:     Pharynx: No oropharyngeal exudate or posterior oropharyngeal erythema.  Eyes:     General: No scleral icterus.       Right eye: No discharge.        Left eye: No discharge.     Conjunctiva/sclera: Conjunctivae normal.  Neck:     Thyroid : No thyromegaly.  Cardiovascular:     Rate and Rhythm: Normal rate and regular rhythm.  Pulmonary:     Effort: No tachypnea, accessory muscle usage or respiratory distress.     Breath sounds: Normal breath sounds. No decreased breath sounds or wheezing.  Chest:  Breasts:    Right: No inverted nipple, mass, nipple discharge or tenderness (no axillary adenopathy).     Left: No inverted nipple, mass, nipple discharge or tenderness (no axilarry adenopathy).  Abdominal:     General: Bowel sounds are normal.     Palpations: Abdomen is soft.     Tenderness: There is no abdominal tenderness.  Musculoskeletal:        General: No swelling or tenderness.     Cervical back: Neck supple.  Lymphadenopathy:     Cervical: No cervical adenopathy.  Skin:    Findings: No erythema or rash.  Neurological:     Mental Status: She is alert and oriented to person, place, and time.  Psychiatric:        Mood and Affect: Mood normal.        Behavior: Behavior normal.         Outpatient Encounter Medications as of 12/25/2023  Medication Sig   Calcium  Carbonate-Vitamin D  (CALTRATE 600+D PO) Take 1 tablet by mouth 2 (two) times daily.   cholecalciferol (VITAMIN D ) 1000 UNITS tablet Take 1,000 Units by mouth daily.   desonide (DESOWEN) 0.05 % ointment at bedtime.   dorzolamide-timolol  (COSOPT) 22.3-6.8 MG/ML ophthalmic solution PLACE 1 DROP TWICE  DAILY TO LEFT EYE   fexofenadine (ALLEGRA) 180 MG tablet Take 180 mg by mouth daily.   fluticasone  (FLONASE ) 50 MCG/ACT nasal spray PLACE 2 SPRAYS DAILY INTO BOTH NOSTRILS.   latanoprost (XALATAN) 0.005 % ophthalmic solution INSTILL ONE DROP IN BOTH EYES AT BEDTIME.   meloxicam  (MOBIC ) 7.5 MG tablet Take 1 tablet (7.5 mg total) by mouth  daily as needed.   pyridOXINE (VITAMIN B-6) 100 MG tablet Take 100 mg by mouth daily.   RHOPRESSA 0.02 % SOLN Place 1 drop into the left eye at bedtime.   rosuvastatin  (CRESTOR ) 20 MG tablet TAKE 1 TABLET BY MOUTH EVERY DAY   [DISCONTINUED] meloxicam  (MOBIC ) 7.5 MG tablet TAKE 1 TABLET BY MOUTH EVERY DAY AS NEEDED   [DISCONTINUED] predniSONE  (STERAPRED UNI-PAK 21 TAB) 10 MG (21) TBPK tablet Take by mouth daily. As directed   No facility-administered encounter medications on file as of 12/25/2023.     Lab Results  Component Value Date   WBC 4.7 06/20/2023   HGB 14.4 06/20/2023   HCT 44.1 06/20/2023   PLT 249.0 06/20/2023   GLUCOSE 96 12/20/2023   CHOL 160 12/20/2023   TRIG 48.0 12/20/2023   HDL 97.10 12/20/2023   LDLCALC 54 12/20/2023   ALT 23 12/20/2023   AST 27 12/20/2023   NA 139 12/20/2023   K 4.3 12/20/2023   CL 104 12/20/2023   CREATININE 0.72 12/20/2023   BUN 23 12/20/2023   CO2 29 12/20/2023   TSH 1.56 06/20/2023       Assessment & Plan:  Routine general medical examination at a health care facility  Hypercholesterolemia Assessment & Plan: Continues on crestor . Continue diet and exercise.  Discussed recent labs.   Lab Results  Component Value Date   CHOL 160 12/20/2023   HDL 97.10 12/20/2023   LDLCALC 54 12/20/2023   TRIG 48.0 12/20/2023   CHOLHDL 2 12/20/2023     Orders: -     CBC with Differential/Platelet; Future -     Hepatic function panel; Future -     Lipid panel; Future -     Basic metabolic panel with GFR; Future  Nontoxic uninodular goiter Assessment & Plan: Follow tsh. Previously discussed f/u thyroid   ultrasound.  Had wanted to hold.  Follow.   Orders: -     TSH; Future  Health care maintenance Assessment & Plan: Physical today 12/25/23.  Colonoscopy 02/2017- recommended f/u in 10 years.  Mammogram 06/20/23 - Birads I.  Bone density - osteopenia   Glaucoma, unspecified glaucoma type, unspecified laterality Assessment & Plan: Followed closely by ophthalmology.    Osteopenia, unspecified location Assessment & Plan: Last bone density - osteopenia. Continue calcium , vitamin D  and weight bearing exercise. Follow.    Other orders -     Meloxicam ; Take 1 tablet (7.5 mg total) by mouth daily as needed.  Dispense: 90 tablet; Refill: 0     Dellar Fenton, MD

## 2024-03-21 ENCOUNTER — Other Ambulatory Visit: Payer: Self-pay | Admitting: Internal Medicine

## 2024-03-21 NOTE — Telephone Encounter (Signed)
 Rx ok'd for meloxicam .

## 2024-04-26 ENCOUNTER — Other Ambulatory Visit: Payer: Self-pay | Admitting: Internal Medicine

## 2024-05-07 ENCOUNTER — Other Ambulatory Visit: Payer: Self-pay | Admitting: Internal Medicine

## 2024-05-07 DIAGNOSIS — Z1231 Encounter for screening mammogram for malignant neoplasm of breast: Secondary | ICD-10-CM

## 2024-06-20 ENCOUNTER — Other Ambulatory Visit: Payer: Self-pay | Admitting: Internal Medicine

## 2024-06-25 ENCOUNTER — Other Ambulatory Visit

## 2024-06-25 DIAGNOSIS — E041 Nontoxic single thyroid nodule: Secondary | ICD-10-CM | POA: Diagnosis not present

## 2024-06-25 DIAGNOSIS — E7800 Pure hypercholesterolemia, unspecified: Secondary | ICD-10-CM | POA: Diagnosis not present

## 2024-06-25 LAB — LIPID PANEL
Cholesterol: 187 mg/dL (ref 0–200)
HDL: 91.9 mg/dL (ref >39.00)
LDL Cholesterol: 82 mg/dL (ref 0–99)
NonHDL: 94.69
Total CHOL/HDL Ratio: 2
Triglycerides: 61 mg/dL (ref 0.0–149.0)
VLDL: 12.2 mg/dL (ref 0.0–40.0)

## 2024-06-25 LAB — CBC WITH DIFFERENTIAL/PLATELET
Basophils Absolute: 0.1 K/uL (ref 0.0–0.1)
Basophils Relative: 1.8 % (ref 0.0–3.0)
Eosinophils Absolute: 0.2 K/uL (ref 0.0–0.7)
Eosinophils Relative: 4.2 % (ref 0.0–5.0)
HCT: 43.7 % (ref 36.0–46.0)
Hemoglobin: 14.5 g/dL (ref 12.0–15.0)
Lymphocytes Relative: 34.1 % (ref 12.0–46.0)
Lymphs Abs: 1.7 K/uL (ref 0.7–4.0)
MCHC: 33.3 g/dL (ref 30.0–36.0)
MCV: 90.7 fl (ref 78.0–100.0)
Monocytes Absolute: 0.5 K/uL (ref 0.1–1.0)
Monocytes Relative: 9.9 % (ref 3.0–12.0)
Neutro Abs: 2.5 K/uL (ref 1.4–7.7)
Neutrophils Relative %: 50 % (ref 43.0–77.0)
Platelets: 263 K/uL (ref 150.0–400.0)
RBC: 4.82 Mil/uL (ref 3.87–5.11)
RDW: 12.5 % (ref 11.5–15.5)
WBC: 4.9 K/uL (ref 4.0–10.5)

## 2024-06-25 LAB — BASIC METABOLIC PANEL WITH GFR
BUN: 25 mg/dL — ABNORMAL HIGH (ref 6–23)
CO2: 29 meq/L (ref 19–32)
Calcium: 8.9 mg/dL (ref 8.4–10.5)
Chloride: 104 meq/L (ref 96–112)
Creatinine, Ser: 0.7 mg/dL (ref 0.40–1.20)
GFR: 81.85 mL/min (ref >60.00)
Glucose, Bld: 87 mg/dL (ref 70–99)
Potassium: 4.4 meq/L (ref 3.5–5.1)
Sodium: 140 meq/L (ref 135–145)

## 2024-06-25 LAB — TSH: TSH: 2.09 u[IU]/mL (ref 0.35–5.50)

## 2024-06-25 LAB — HEPATIC FUNCTION PANEL
ALT: 24 U/L (ref 0–35)
AST: 24 U/L (ref 0–37)
Albumin: 4.2 g/dL (ref 3.5–5.2)
Alkaline Phosphatase: 53 U/L (ref 39–117)
Bilirubin, Direct: 0.1 mg/dL (ref 0.0–0.3)
Total Bilirubin: 0.6 mg/dL (ref 0.2–1.2)
Total Protein: 6.4 g/dL (ref 6.0–8.3)

## 2024-06-26 ENCOUNTER — Ambulatory Visit
Admission: RE | Admit: 2024-06-26 | Discharge: 2024-06-26 | Disposition: A | Discharge status: Home / Self Care | Source: Ambulatory Visit | Attending: Internal Medicine | Admitting: Internal Medicine

## 2024-06-26 ENCOUNTER — Ambulatory Visit: Payer: Self-pay | Admitting: Internal Medicine

## 2024-06-26 DIAGNOSIS — Z1231 Encounter for screening mammogram for malignant neoplasm of breast: Secondary | ICD-10-CM | POA: Diagnosis present

## 2024-06-27 ENCOUNTER — Encounter: Payer: Self-pay | Admitting: Internal Medicine

## 2024-06-27 ENCOUNTER — Ambulatory Visit: Admitting: Internal Medicine

## 2024-06-27 VITALS — BP 132/78 | HR 77 | Temp 98.2°F | Ht 61.0 in | Wt 123.4 lb

## 2024-06-27 DIAGNOSIS — E7800 Pure hypercholesterolemia, unspecified: Secondary | ICD-10-CM | POA: Diagnosis not present

## 2024-06-27 DIAGNOSIS — E041 Nontoxic single thyroid nodule: Secondary | ICD-10-CM | POA: Diagnosis not present

## 2024-06-27 DIAGNOSIS — E2839 Other primary ovarian failure: Secondary | ICD-10-CM | POA: Diagnosis not present

## 2024-06-27 DIAGNOSIS — E559 Vitamin D deficiency, unspecified: Secondary | ICD-10-CM | POA: Diagnosis not present

## 2024-06-27 DIAGNOSIS — Z1211 Encounter for screening for malignant neoplasm of colon: Secondary | ICD-10-CM

## 2024-06-27 NOTE — Progress Notes (Signed)
 Subjective:    Patient ID: Vickie Crawford, female    DOB: October 05, 1945, 78 y.o.   MRN: [id number]  Patient here for  Chief Complaint  Patient presents with   Medical Management of Chronic Issues    HPI Here for a scheduled follow up - follow up regarding hypercholesterolemia. Had mammogram yesterday. Results pending. She is doing well. Staying active. No chest pain or sob reported. No abdominal pain or bowel change reported. No cough or congestion. Reviewed blood pressures - outside checks - averaging 110-120/70s.    Past Medical History:  Diagnosis Date   Allergy    Arthritis    fingers, toes   Glaucoma    Hypercholesterolemia    Motion sickness    Rough ocean waters   Osteopenia    Ovarian cyst    Hospitalized 1969 and 1974 for removal   Thyroid  nodule    Vitamin D  deficiency    Past Surgical History:  Procedure Laterality Date   CATARACT EXTRACTION W/PHACO Left 06/28/2020   Procedure: CATARACT EXTRACTION PHACO AND INTRAOCULAR LENS PLACEMENT (IOC) LEFT ISTENT INJ;  Surgeon: Myrna Adine Anes, MD;  Location: [facility];  Service: Ophthalmology;  Laterality: Left;  7.96 0:52.3   CATARACT EXTRACTION W/PHACO Right 08/02/2020   Procedure: CATARACT EXTRACTION PHACO AND INTRAOCULAR LENS PLACEMENT (IOC) RIGHT ISTENT INJ;  Surgeon: Myrna Adine Anes, MD;  Location: [facility];  Service: Ophthalmology;  Laterality: Right;  1.84 0:20.0   COLONOSCOPY W/ POLYPECTOMY     COLONOSCOPY WITH PROPOFOL  N/A 03/07/2017   Procedure: COLONOSCOPY WITH PROPOFOL ;  Surgeon: Viktoria Lamar DASEN, MD;  Location: [facility];  Service: Endoscopy;  Laterality: N/A;   OVARIAN CYST REMOVAL     1969 and 1974   Family History  Problem Relation Age of Onset   Hypertension Mother    Heart disease Mother    Stroke Father    Brain cancer Sister    Breast cancer Neg Hx    Social History   Socioeconomic History   Marital status: Married    Spouse name: Not on file   Number of children:  2   Years of education: Not on file   Highest education level: Not on file  Occupational History   Not on file  Tobacco Use   Smoking status: Former    Current packs/day: 0.00    Types: Cigarettes    Quit date: 1995    Years since quitting: 30.8   Smokeless tobacco: Never  Vaping Use   Vaping status: Never Used  Substance and Sexual Activity   Alcohol use: Yes    Alcohol/week: 2.0 standard drinks of alcohol    Types: 2 Glasses of wine per week   Drug use: No   Sexual activity: Not on file  Other Topics Concern   Not on file  Social History Narrative   Not on file   Social Drivers of Health   Financial Resource Strain: Not on file  Food Insecurity: Not on file  Transportation Needs: Not on file  Physical Activity: Not on file  Stress: Not on file  Social Connections: Not on file     Review of Systems  Constitutional:  Negative for appetite change and unexpected weight change.  HENT:  Negative for congestion and sinus pressure.   Respiratory:  Negative for cough, chest tightness and shortness of breath.   Cardiovascular:  Negative for chest pain, palpitations and leg swelling.  Gastrointestinal:  Negative for abdominal pain, diarrhea, nausea and vomiting.  Genitourinary:  Negative for difficulty urinating and dysuria.  Musculoskeletal:  Negative for joint swelling and myalgias.  Skin:  Negative for color change and rash.  Neurological:  Negative for dizziness and headaches.  Psychiatric/Behavioral:  Negative for agitation and dysphoric mood.        Objective:     BP 132/78   Pulse 77   Temp 98.2 F (36.8 C) (Oral)   Ht 5' 1 (1.549 m)   Wt 123 lb 6.4 oz (56 kg)   SpO2 97%   BMI 23.32 kg/m  Wt Readings from Last 3 Encounters:  06/27/24 123 lb 6.4 oz (56 kg)  12/25/23 128 lb (58.1 kg)  06/26/23 131 lb 9.6 oz (59.7 kg)    Physical Exam Vitals reviewed.  Constitutional:      General: She is not in acute distress.    Appearance: Normal appearance.   HENT:     Head: Normocephalic and atraumatic.     Right Ear: External ear normal.     Left Ear: External ear normal.     Mouth/Throat:     Pharynx: No oropharyngeal exudate or posterior oropharyngeal erythema.  Eyes:     General: No scleral icterus.       Right eye: No discharge.        Left eye: No discharge.     Conjunctiva/sclera: Conjunctivae normal.  Neck:     Thyroid : No thyromegaly.  Cardiovascular:     Rate and Rhythm: Normal rate and regular rhythm.  Pulmonary:     Effort: No respiratory distress.     Breath sounds: Normal breath sounds. No wheezing.  Abdominal:     General: Bowel sounds are normal.     Palpations: Abdomen is soft.     Tenderness: There is no abdominal tenderness.  Musculoskeletal:        General: No swelling or tenderness.     Cervical back: Neck supple. No tenderness.  Lymphadenopathy:     Cervical: No cervical adenopathy.  Skin:    Findings: No erythema or rash.  Neurological:     Mental Status: She is alert.  Psychiatric:        Mood and Affect: Mood normal.        Behavior: Behavior normal.         Outpatient Encounter Medications as of 06/27/2024  Medication Sig   Calcium  Carbonate-Vitamin D  (CALTRATE 600+D PO) Take 1 tablet by mouth 2 (two) times daily.   cholecalciferol (VITAMIN D ) 1000 UNITS tablet Take 1,000 Units by mouth daily.   desonide (DESOWEN) 0.05 % ointment at bedtime.   dorzolamide-timolol  (COSOPT) 22.3-6.8 MG/ML ophthalmic solution PLACE 1 DROP TWICE DAILY TO LEFT EYE   fexofenadine (ALLEGRA) 180 MG tablet Take 180 mg by mouth daily.   fluticasone  (FLONASE ) 50 MCG/ACT nasal spray PLACE 2 SPRAYS DAILY INTO BOTH NOSTRILS.   latanoprost (XALATAN) 0.005 % ophthalmic solution INSTILL ONE DROP IN BOTH EYES AT BEDTIME.   meloxicam  (MOBIC ) 7.5 MG tablet TAKE 1 TABLET BY MOUTH DAILY AS NEEDED.   pyridOXINE (VITAMIN B-6) 100 MG tablet Take 100 mg by mouth daily.   RHOPRESSA 0.02 % SOLN Place 1 drop into the left eye at bedtime.    rosuvastatin  (CRESTOR ) 20 MG tablet TAKE 1 TABLET BY MOUTH EVERY DAY   No facility-administered encounter medications on file as of 06/27/2024.     Lab Results  Component Value Date   WBC 4.9 06/25/2024   HGB 14.5 06/25/2024   HCT 43.7 06/25/2024  PLT 263.0 06/25/2024   GLUCOSE 87 06/25/2024   CHOL 187 06/25/2024   TRIG 61.0 06/25/2024   HDL 91.90 06/25/2024   LDLCALC 82 06/25/2024   ALT 24 06/25/2024   AST 24 06/25/2024   NA 140 06/25/2024   K 4.4 06/25/2024   CL 104 06/25/2024   CREATININE 0.70 06/25/2024   BUN 25 (H) 06/25/2024   CO2 29 06/25/2024   TSH 2.09 06/25/2024       Assessment & Plan:  Hypercholesterolemia Assessment & Plan: Continues on crestor . Continue diet and exercise.  Discussed recent labs.   Lab Results  Component Value Date   CHOL 187 06/25/2024   HDL 91.90 06/25/2024   LDLCALC 82 06/25/2024   TRIG 61.0 06/25/2024   CHOLHDL 2 06/25/2024     Orders: -     Hepatic function panel; Future -     Lipid panel; Future -     Basic metabolic panel with GFR; Future  Estrogen deficiency  Vitamin D  deficiency Assessment & Plan: Check vitamin D  level with next labs.    Pure hypercholesterolemia Assessment & Plan: On crestor .  Low cholesterol diet and exercise.  Follow lipid panel and liver function tests.   Lab Results  Component Value Date   CHOL 187 06/25/2024   HDL 91.90 06/25/2024   LDLCALC 82 06/25/2024   TRIG 61.0 06/25/2024   CHOLHDL 2 06/25/2024      Nontoxic uninodular goiter Assessment & Plan: Follow tsh. Previously discussed f/u thyroid  ultrasound.  Has wanted to hold.  Follow.    Colon cancer screening Assessment & Plan: Colonoscopy 02/2017.  Recommended f/u colonoscopy in 10 years.        Allena Hamilton, MD

## 2024-06-27 NOTE — Assessment & Plan Note (Signed)
 Continues on crestor . Continue diet and exercise.  Discussed recent labs.   Lab Results  Component Value Date   CHOL 187 06/25/2024   HDL 91.90 06/25/2024   LDLCALC 82 06/25/2024   TRIG 61.0 06/25/2024   CHOLHDL 2 06/25/2024

## 2024-06-28 ENCOUNTER — Encounter: Payer: Self-pay | Admitting: Internal Medicine

## 2024-06-28 NOTE — Assessment & Plan Note (Signed)
 On crestor .  Low cholesterol diet and exercise.  Follow lipid panel and liver function tests.   Lab Results  Component Value Date   CHOL 187 06/25/2024   HDL 91.90 06/25/2024   LDLCALC 82 06/25/2024   TRIG 61.0 06/25/2024   CHOLHDL 2 06/25/2024

## 2024-06-28 NOTE — Assessment & Plan Note (Signed)
 Follow tsh. Previously discussed f/u thyroid  ultrasound.  Has wanted to hold.  Follow.

## 2024-06-28 NOTE — Assessment & Plan Note (Signed)
 Check vitamin D level with next labs.  ?

## 2024-06-28 NOTE — Assessment & Plan Note (Signed)
Colonoscopy 02/2017.  Recommended f/u colonoscopy in 10 years.   

## 2024-09-18 ENCOUNTER — Other Ambulatory Visit: Payer: Self-pay | Admitting: Internal Medicine

## 2024-12-30 ENCOUNTER — Other Ambulatory Visit

## 2025-01-01 ENCOUNTER — Encounter: Admitting: Internal Medicine
